# Patient Record
Sex: Male | Born: 1990 | Race: White | Hispanic: No | Marital: Married | Smoking: Never smoker
Health system: Southern US, Community
[De-identification: ages and names within clinical notes are randomized; demographics above are authoritative.]

---
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
## 2013-06-04 HISTORY — PX: ORIF HUMERUS FRACTURE: SHX2126

## 2016-11-16 ENCOUNTER — Encounter: Payer: Self-pay | Admitting: Family Medicine

## 2016-11-16 ENCOUNTER — Ambulatory Visit (INDEPENDENT_AMBULATORY_CARE_PROVIDER_SITE_OTHER): Payer: BLUE CROSS/BLUE SHIELD | Admitting: Family Medicine

## 2016-11-16 VITALS — BP 124/76 | Temp 97.9°F | Ht 72.0 in | Wt 208.0 lb

## 2016-11-16 DIAGNOSIS — R197 Diarrhea, unspecified: Secondary | ICD-10-CM | POA: Diagnosis not present

## 2016-11-16 LAB — POC URINALSYSI DIPSTICK (AUTOMATED)
Bilirubin, UA: NEGATIVE
Blood, UA: NEGATIVE
Glucose, UA: NEGATIVE
KETONES UA: NEGATIVE
Leukocytes, UA: NEGATIVE
Nitrite, UA: NEGATIVE
PH UA: 6 (ref 5.0–8.0)
PROTEIN UA: NEGATIVE
Spec Grav, UA: >=1.03 — AB (ref 1.010–1.025)
UROBILINOGEN UA: 0.2 U/dL

## 2016-11-16 LAB — CBC WITH DIFFERENTIAL/PLATELET
BASOS PCT: 1 %
Basophils Absolute: 45 cells/uL (ref 0–200)
EOS ABS: 135 {cells}/uL (ref 15–500)
EOS PCT: 3 %
HCT: 42.9 % (ref 38.5–50.0)
Hemoglobin: 13.9 g/dL (ref 13.2–17.1)
Lymphocytes Relative: 35 %
Lymphs Abs: 1575 cells/uL (ref 850–3900)
MCH: 28.4 pg (ref 27.0–33.0)
MCHC: 32.4 g/dL (ref 32.0–36.0)
MCV: 87.7 fL (ref 80.0–100.0)
MPV: 8.9 fL (ref 7.5–12.5)
Monocytes Absolute: 270 cells/uL (ref 200–950)
Monocytes Relative: 6 %
NEUTROS ABS: 2475 {cells}/uL (ref 1500–7800)
Neutrophils Relative %: 55 %
PLATELETS: 176 10*3/uL (ref 140–400)
RBC: 4.89 MIL/uL (ref 4.20–5.80)
RDW: 14.1 % (ref 11.0–15.0)
WBC: 4.5 10*3/uL (ref 3.8–10.8)

## 2016-11-16 LAB — BASIC METABOLIC PANEL
BUN: 22 mg/dL (ref 7–25)
CALCIUM: 9.2 mg/dL (ref 8.6–10.3)
CHLORIDE: 104 mmol/L (ref 98–110)
CO2: 26 mmol/L (ref 20–31)
CREATININE: 1.28 mg/dL (ref 0.60–1.35)
Glucose, Bld: 91 mg/dL (ref 65–99)
Potassium: 4.4 mmol/L (ref 3.5–5.3)
Sodium: 140 mmol/L (ref 135–146)

## 2016-11-16 LAB — TSH: TSH: 1.34 mIU/L (ref 0.40–4.50)

## 2016-11-16 LAB — HEPATIC FUNCTION PANEL
ALT: 26 U/L (ref 9–46)
AST: 31 U/L (ref 10–40)
Albumin: 4.5 g/dL (ref 3.6–5.1)
Alkaline Phosphatase: 58 U/L (ref 40–115)
BILIRUBIN DIRECT: 0.1 mg/dL (ref <=0.2)
BILIRUBIN INDIRECT: 0.2 mg/dL (ref 0.2–1.2)
BILIRUBIN TOTAL: 0.3 mg/dL (ref 0.2–1.2)
Total Protein: 6.2 g/dL (ref 6.1–8.1)

## 2016-11-16 NOTE — Progress Notes (Signed)
   Subjective:    Patient ID: Philip Nguyen, male    DOB: 2/Ross Ludwig20/1992, 26 y.o.   MRN: [id number]  HPI 26 yr old male to establish with us and to discuss 4 weeks of generalized abdominal cramps, diarrhea, a 6 lb weight loss, and loss of appetite. No fever. No nausea or vomiting. No UTI symptoms. No recent travel. No recent antibiotic use. Yesterday and today he feels better and he had a formed stool last night. No BMs so far today. His appetite has improved and the cramps are gone.    Review of Systems  Constitutional: Negative.   Respiratory: Negative.   Cardiovascular: Negative.   Gastrointestinal: Positive for abdominal pain and diarrhea. Negative for abdominal distention, anal bleeding, blood in stool, constipation, nausea, rectal pain and vomiting.  Genitourinary: Negative.   Musculoskeletal: Negative.   Skin: Negative.   Neurological: Negative.        Objective:   Physical Exam  Constitutional: He is oriented to person, place, and time. He appears well-developed and well-nourished. No distress.  Neck: No thyromegaly present.  Cardiovascular: Normal rate, regular rhythm, normal heart sounds and intact distal pulses.   Pulmonary/Chest: Effort normal and breath sounds normal. No respiratory distress. He has no wheezes. He has no rales.  Abdominal: Soft. Bowel sounds are normal. He exhibits no distension and no mass. There is no tenderness. There is no rebound and no guarding.  Lymphadenopathy:    He has no cervical adenopathy.  Neurological: He is alert and oriented to person, place, and time.          Assessment & Plan:  He has had 4 weeks of GI distress which sounds like an infection of some sort. He seems to be at the end of this and he feels much better. We will get labs today to shed some light on this process. No treatment is needed unless he gets worse again.  Gershon CraneStephen Rochele Lueck, MD

## 2016-11-16 NOTE — Patient Instructions (Signed)
WE NOW OFFER   Philip Nguyen's FAST TRACK!!!  SAME DAY Appointments for ACUTE CARE  Such as: Sprains, Injuries, cuts, abrasions, rashes, muscle pain, joint pain, back pain Colds, flu, sore throats, headache, allergies, cough, fever  Ear pain, sinus and eye infections Abdominal pain, nausea, vomiting, diarrhea, upset stomach Animal/insect bites  3 Easy Ways to Schedule: Walk-In Scheduling Call in scheduling Mychart Sign-up: [email or web address]         

## 2016-11-17 LAB — LIPASE: Lipase: 31 U/L (ref 7–60)

## 2016-11-17 LAB — AMYLASE: Amylase: 98 U/L (ref 21–101)

## 2017-01-02 ENCOUNTER — Ambulatory Visit (INDEPENDENT_AMBULATORY_CARE_PROVIDER_SITE_OTHER): Payer: BLUE CROSS/BLUE SHIELD | Admitting: Family Medicine

## 2017-01-02 ENCOUNTER — Encounter: Payer: Self-pay | Admitting: Family Medicine

## 2017-01-02 VITALS — BP 125/78 | HR 55 | Ht 72.0 in | Wt 205.0 lb

## 2017-01-02 DIAGNOSIS — L03011 Cellulitis of right finger: Secondary | ICD-10-CM | POA: Diagnosis not present

## 2017-01-02 MED ORDER — CEPHALEXIN 500 MG PO CAPS: 500.0000 mg | ORAL_CAPSULE | Freq: Four times a day (QID) | ORAL | 0 refills | Status: AC

## 2017-01-02 NOTE — Progress Notes (Signed)
   Subjective:    Patient ID: Philip Nguyen, male    DOB: 20-Sep-1990, 26 y.o.   MRN: [id number]  HPI Here for 5 days of pain and swelling in the right 3rd finger. No recent trauma.    Review of Systems  Constitutional: Negative.   Respiratory: Negative.   Cardiovascular: Negative.   Skin: Positive for color change.       Objective:   Physical Exam  Constitutional: He appears well-developed and well-nourished.  Cardiovascular: Normal rate, regular rhythm, normal heart sounds and intact distal pulses.   Pulmonary/Chest: Effort normal and breath sounds normal. No respiratory distress. He has no wheezes. He has no rales.  Skin:  The skin around the right 3rd fingernail is red, puffy, and tender           Assessment & Plan:  Paronychia, treat with Keflex. Gershon CraneStephen Yeng Perz, MD

## 2017-01-02 NOTE — Patient Instructions (Signed)
WE NOW OFFER   Philip Nguyen's FAST TRACK!!!  SAME DAY Appointments for ACUTE CARE  Such as: Sprains, Injuries, cuts, abrasions, rashes, muscle pain, joint pain, back pain Colds, flu, sore throats, headache, allergies, cough, fever  Ear pain, sinus and eye infections Abdominal pain, nausea, vomiting, diarrhea, upset stomach Animal/insect bites  3 Easy Ways to Schedule: Walk-In Scheduling Call in scheduling Mychart Sign-up: [email or web address]         

## 2017-11-20 DIAGNOSIS — Z412 Encounter for routine and ritual male circumcision: Secondary | ICD-10-CM | POA: Diagnosis not present

## 2017-11-21 DIAGNOSIS — J029 Acute pharyngitis, unspecified: Secondary | ICD-10-CM | POA: Diagnosis not present

## 2017-11-21 DIAGNOSIS — M791 Myalgia, unspecified site: Secondary | ICD-10-CM | POA: Diagnosis not present

## 2017-11-21 DIAGNOSIS — T63481A Toxic effect of venom of other arthropod, accidental (unintentional), initial encounter: Secondary | ICD-10-CM | POA: Diagnosis not present

## 2017-11-22 ENCOUNTER — Encounter (HOSPITAL_COMMUNITY): Payer: Self-pay

## 2017-11-22 ENCOUNTER — Emergency Department (HOSPITAL_COMMUNITY)
Admission: EM | Admit: 2017-11-22 | Discharge: 2017-11-23 | Disposition: A | Discharge status: Home / Self Care | Payer: BLUE CROSS/BLUE SHIELD | Attending: Emergency Medicine | Admitting: Emergency Medicine

## 2017-11-22 ENCOUNTER — Other Ambulatory Visit: Payer: Self-pay

## 2017-11-22 DIAGNOSIS — R1084 Generalized abdominal pain: Secondary | ICD-10-CM | POA: Diagnosis not present

## 2017-11-22 DIAGNOSIS — K529 Noninfective gastroenteritis and colitis, unspecified: Secondary | ICD-10-CM | POA: Diagnosis not present

## 2017-11-22 DIAGNOSIS — R109 Unspecified abdominal pain: Secondary | ICD-10-CM | POA: Diagnosis not present

## 2017-11-22 DIAGNOSIS — R197 Diarrhea, unspecified: Secondary | ICD-10-CM | POA: Diagnosis not present

## 2017-11-22 LAB — CBC
HCT: 43.6 % (ref 39.0–52.0)
Hemoglobin: 13.9 g/dL (ref 13.0–17.0)
MCH: 27.4 pg (ref 26.0–34.0)
MCHC: 31.9 g/dL (ref 30.0–36.0)
MCV: 85.8 fL (ref 78.0–100.0)
PLATELETS: 143 10*3/uL — AB (ref 150–400)
RBC: 5.08 MIL/uL (ref 4.22–5.81)
RDW: 13.6 % (ref 11.5–15.5)
WBC: 5.4 10*3/uL (ref 4.0–10.5)

## 2017-11-22 LAB — COMPREHENSIVE METABOLIC PANEL
ALT: 27 U/L (ref 17–63)
AST: 31 U/L (ref 15–41)
Albumin: 3.5 g/dL (ref 3.5–5.0)
Alkaline Phosphatase: 34 U/L — ABNORMAL LOW (ref 38–126)
Anion gap: 8 (ref 5–15)
BILIRUBIN TOTAL: 0.4 mg/dL (ref 0.3–1.2)
BUN: 13 mg/dL (ref 6–20)
CO2: 28 mmol/L (ref 22–32)
CREATININE: 1.37 mg/dL — AB (ref 0.61–1.24)
Calcium: 8.9 mg/dL (ref 8.9–10.3)
Chloride: 103 mmol/L (ref 101–111)
GFR calc Af Amer: >60 mL/min (ref >60)
Glucose, Bld: 144 mg/dL — ABNORMAL HIGH (ref 65–99)
Potassium: 3.7 mmol/L (ref 3.5–5.1)
Sodium: 139 mmol/L (ref 135–145)
TOTAL PROTEIN: 6.3 g/dL — AB (ref 6.5–8.1)

## 2017-11-22 LAB — URINALYSIS, ROUTINE W REFLEX MICROSCOPIC
Bacteria, UA: NONE SEEN
Bilirubin Urine: NEGATIVE
GLUCOSE, UA: 50 mg/dL — AB
KETONES UR: NEGATIVE mg/dL
LEUKOCYTES UA: NEGATIVE
Nitrite: NEGATIVE
PH: 5 (ref 5.0–8.0)
Protein, ur: 30 mg/dL — AB
SPECIFIC GRAVITY, URINE: 1.027 (ref 1.005–1.030)

## 2017-11-22 LAB — LIPASE, BLOOD: Lipase: 35 U/L (ref 11–51)

## 2017-11-22 MED ORDER — SODIUM CHLORIDE 0.9 % IV BOLUS
1000.0000 mL | Freq: Once | INTRAVENOUS | Status: AC
  Administered 2017-11-22: 1000 mL via INTRAVENOUS

## 2017-11-22 NOTE — ED Triage Notes (Signed)
Pt endorses generalized bodyaches, chills x 3 days. [facility] doxycycline for possible [place] spotted fever. Pt then began having generalized abd pain yesterday afternoon. Diarrhea x 3 days. VSS. Denies n/v. Afebrile.

## 2017-11-23 ENCOUNTER — Emergency Department (HOSPITAL_COMMUNITY): Payer: BLUE CROSS/BLUE SHIELD

## 2017-11-23 DIAGNOSIS — R197 Diarrhea, unspecified: Secondary | ICD-10-CM | POA: Diagnosis not present

## 2017-11-23 DIAGNOSIS — R109 Unspecified abdominal pain: Secondary | ICD-10-CM | POA: Diagnosis not present

## 2017-11-23 MED ORDER — DICYCLOMINE HCL 10 MG PO CAPS
10.0000 mg | ORAL_CAPSULE | Freq: Once | ORAL | Status: AC
  Administered 2017-11-23: 10 mg via ORAL
  Filled 2017-11-23: qty 1

## 2017-11-23 MED ORDER — CIPROFLOXACIN HCL 500 MG PO TABS
500.0000 mg | ORAL_TABLET | Freq: Once | ORAL | Status: AC
  Administered 2017-11-23: 500 mg via ORAL
  Filled 2017-11-23: qty 1

## 2017-11-23 MED ORDER — IOHEXOL 300 MG/ML  SOLN
100.0000 mL | Freq: Once | INTRAMUSCULAR | Status: AC | PRN
  Administered 2017-11-23: 100 mL via INTRAVENOUS

## 2017-11-23 MED ORDER — CIPROFLOXACIN HCL 500 MG PO TABS: 500.0000 mg | ORAL_TABLET | Freq: Two times a day (BID) | ORAL | 0 refills | Status: DC

## 2017-11-23 MED ORDER — DICYCLOMINE HCL 20 MG PO TABS: 20.0000 mg | ORAL_TABLET | Freq: Two times a day (BID) | ORAL | 0 refills | Status: DC
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
## 2017-11-23 NOTE — ED Notes (Signed)
Patient transported to CT 

## 2017-11-23 NOTE — Discharge Instructions (Signed)
Stop Doxycycline Start Cipro twice daily for the next week Take Bentyl as needed for abdominal pain/cramping Return if worsening

## 2017-11-26 ENCOUNTER — Encounter: Payer: Self-pay | Admitting: Family Medicine

## 2017-11-26 ENCOUNTER — Ambulatory Visit: Payer: BLUE CROSS/BLUE SHIELD | Admitting: Family Medicine

## 2017-11-26 VITALS — BP 114/70 | HR 69 | Temp 98.4°F | Ht 73.0 in | Wt 204.8 lb

## 2017-11-26 DIAGNOSIS — K529 Noninfective gastroenteritis and colitis, unspecified: Secondary | ICD-10-CM

## 2017-11-26 NOTE — Progress Notes (Signed)
   Subjective:    Patient ID: Philip Nguyen, male    DOB: 11-03-1990, 27 y.o.   MRN: [id number]  HPI Here to follow up an ER visit on 11-22-17 for fever to 102 degrees, abdominal pains (worse in the RLQ), body aches, and diarrhea. No nausea or vomiting. No recent travel. [facility] and was started on Doxycycline. In the ER is WBC was normal. A CT scan showed a normal appendix, diffuse thickening of the colon walls, and some RLQ mesenteric lymphadenopathy. He was diagnosed with enterocolitis. The Doxycycline was stopped and he was started on Cipro. Since then he has bee feeling better. The fever has resolved and the abdominal pains have greatly improved. His appetite is returning. The main thing bothering him now is diarrhea. He has gone back to work and he even had a light work out at his gym yesterday.    Review of Systems  Constitutional: Negative.   Respiratory: Negative.   Cardiovascular: Negative.   Gastrointestinal: Positive for abdominal pain and diarrhea. Negative for abdominal distention, anal bleeding, blood in stool, constipation, nausea, rectal pain and vomiting.  Genitourinary: Negative.   Neurological: Negative.        Objective:   Physical Exam  Constitutional: He appears well-developed and well-nourished. No distress.  Cardiovascular: Normal rate, regular rhythm, normal heart sounds and intact distal pulses.  Pulmonary/Chest: Effort normal and breath sounds normal. No stridor. No respiratory distress. He has no wheezes. He has no rales.  Abdominal: Soft. Bowel sounds are normal. He exhibits no distension and no mass. There is no tenderness. There is no rebound and no guarding. No hernia.          Assessment & Plan:  Resolving enterocolitis. He will finish out the Cipro. He may use Imodium as needed for the diarrhea. Recheck prn.  Gershon CraneStephen Fry, MD

## 2018-02-10 DIAGNOSIS — J019 Acute sinusitis, unspecified: Secondary | ICD-10-CM | POA: Diagnosis not present

## 2018-02-10 DIAGNOSIS — B9689 Other specified bacterial agents as the cause of diseases classified elsewhere: Secondary | ICD-10-CM | POA: Diagnosis not present

## 2018-02-10 DIAGNOSIS — R05 Cough: Secondary | ICD-10-CM | POA: Diagnosis not present

## 2018-08-18 IMAGING — CT CT ABD-PELV W/ CM
2 of 4 series · 16 of 46 positions shown, 18 images · IV contrast (Omni 300)
Comparison: None.

CLINICAL DATA: Abdominal pain with body ache and chills diarrhea

EXAM:
CT ABDOMEN AND PELVIS WITH CONTRAST
TECHNIQUE: Multidetector CT imaging of the abdomen and pelvis was performed
using the standard protocol following bolus administration of
intravenous contrast.
CONTRAST:  100mL OMNIPAQUE IOHEXOL 300 MG/ML  SOLN

[Series 3: a/p w/ 5mm · axial · 0.98mm/px · z∈[+576,+1051]mm · 13 of 105 slices shown, 15 images]
[im 5/105  soft-tissue]
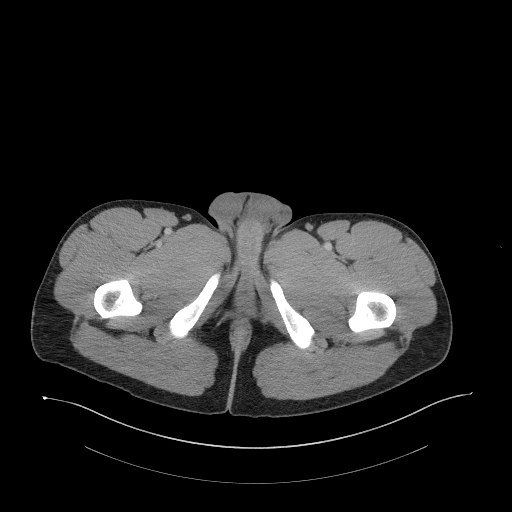
[im 5/105  bone]
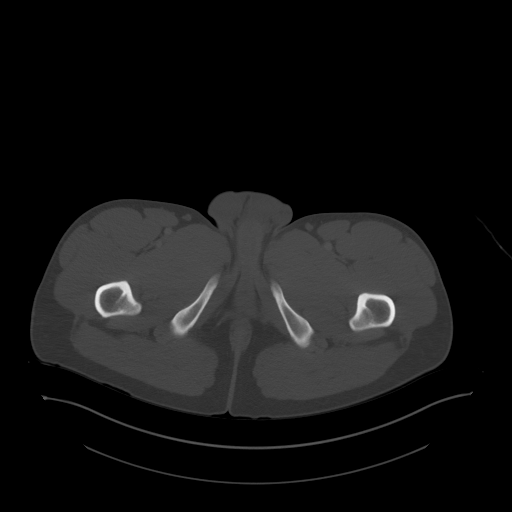
[im 13/105  soft-tissue]
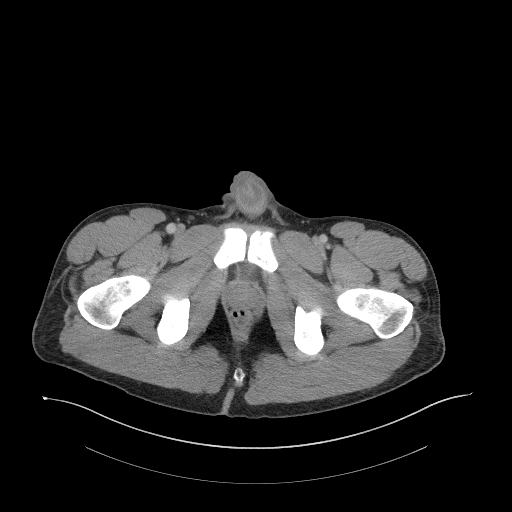
[im 21/105  soft-tissue]
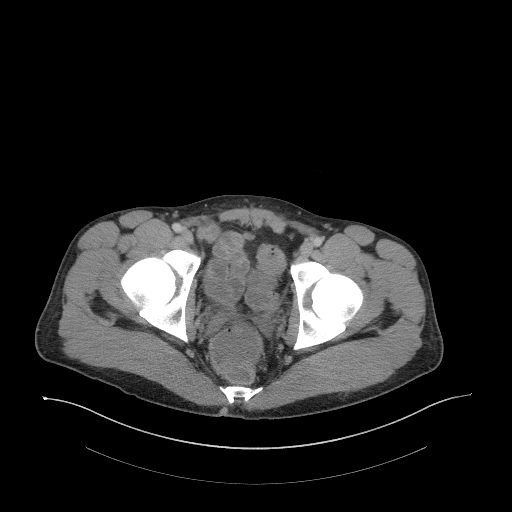
[im 30/105  soft-tissue]
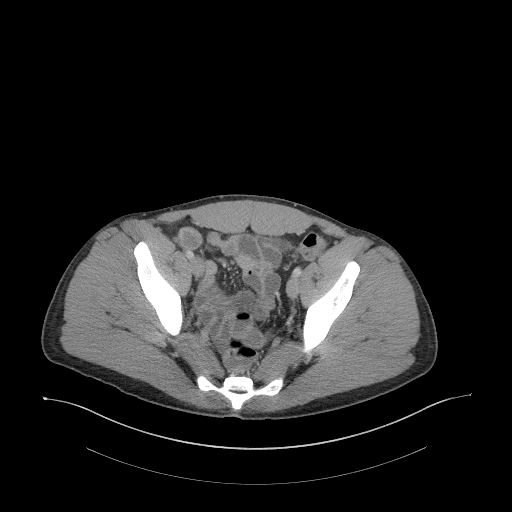
[im 38/105  soft-tissue]
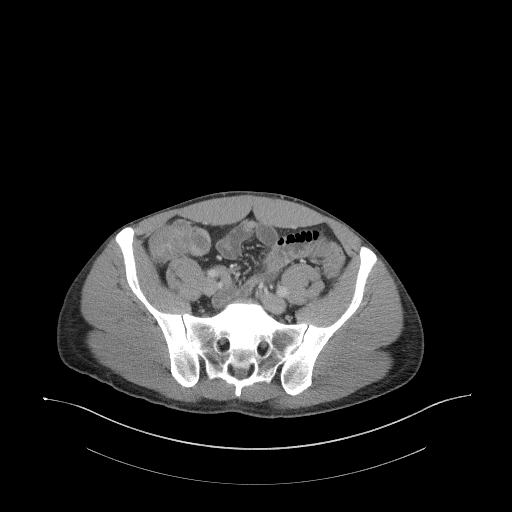
[im 46/105  soft-tissue]
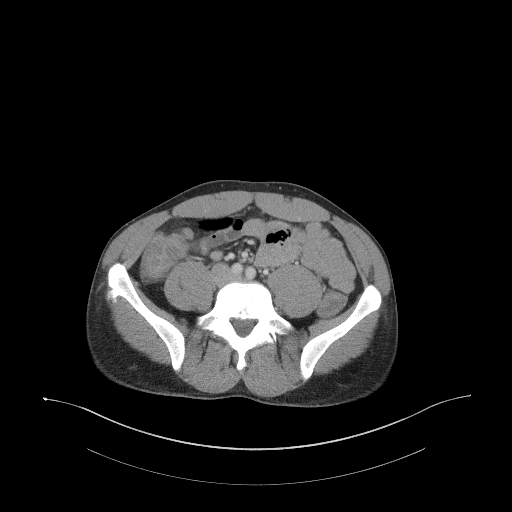
[im 55/105  soft-tissue]
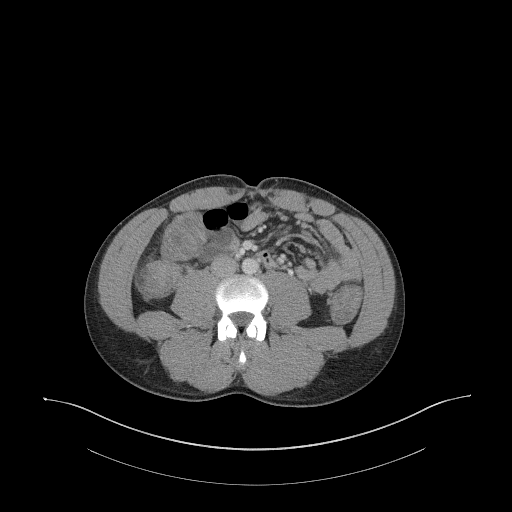
[im 59/105  soft-tissue]
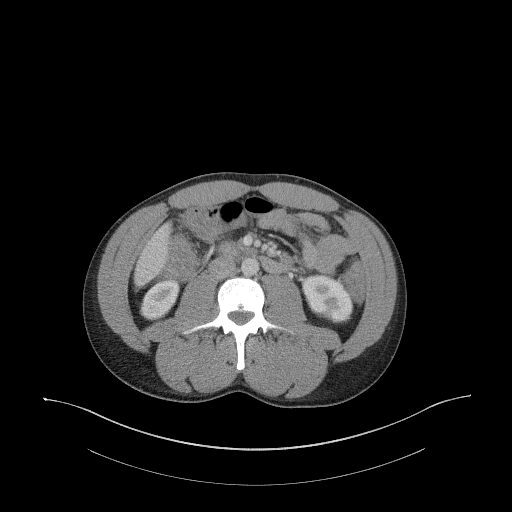
[im 67/105  soft-tissue]
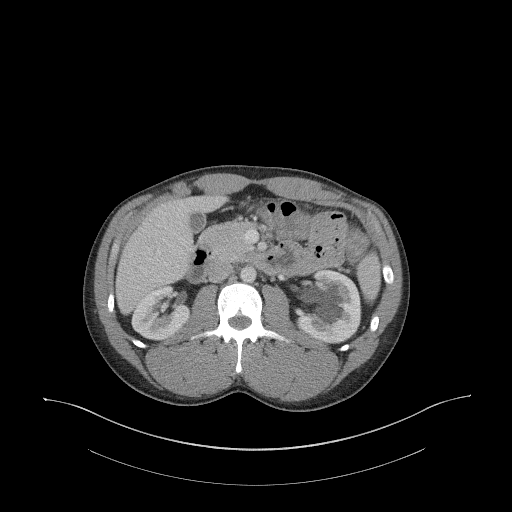
[im 67/105  bone]
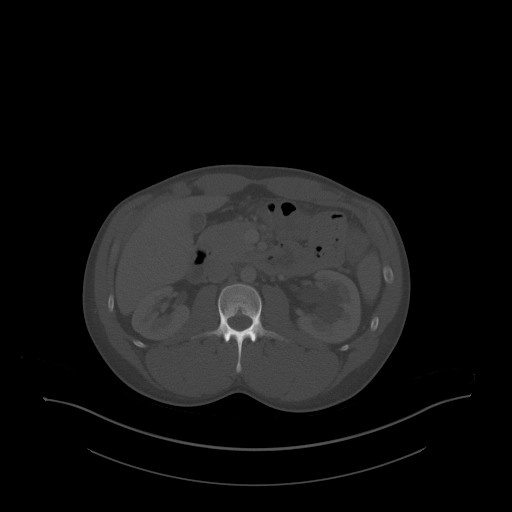
[im 75/105  soft-tissue]
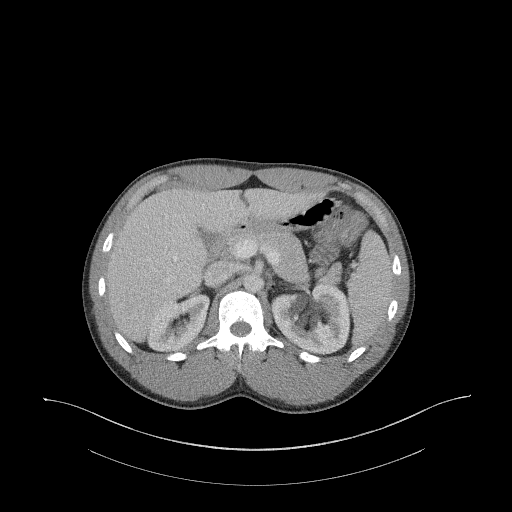
[im 84/105  soft-tissue]
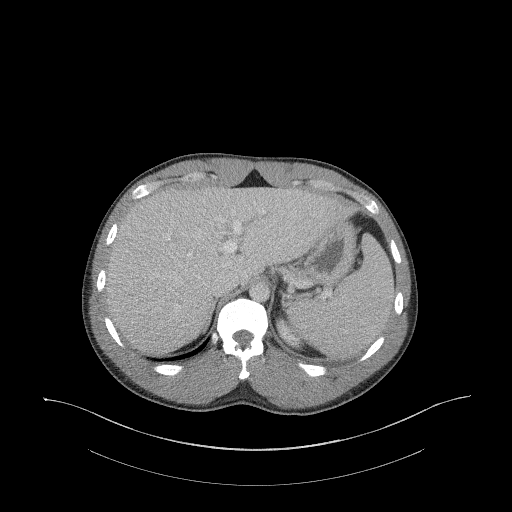
[im 92/105  soft-tissue]
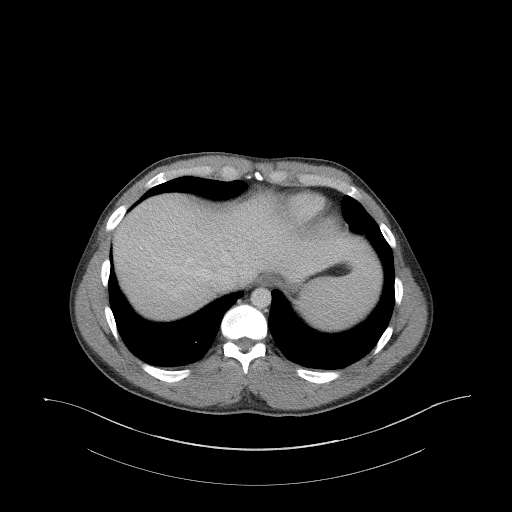
[im 100/105  soft-tissue]
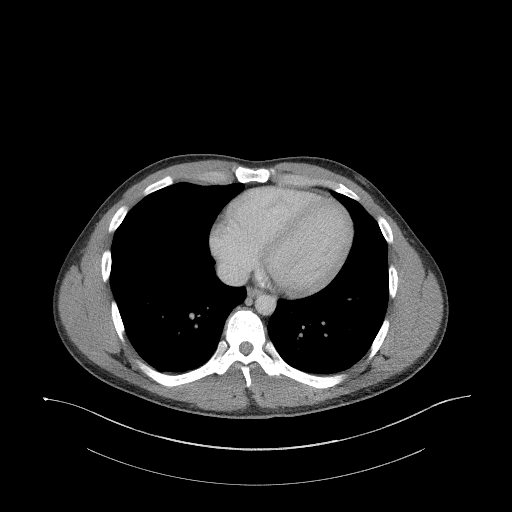

[Series 6: a/p w/ cor · coronal · 0.78mm/px · 3 of 150 slices shown]
[im 50/150  soft-tissue]
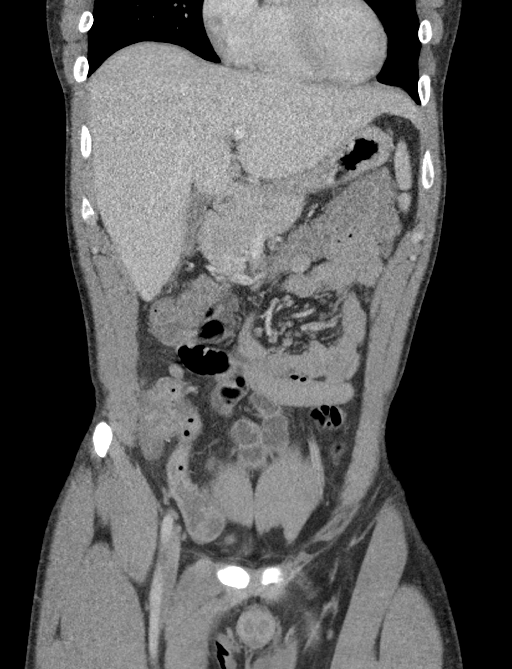
[im 67/150  soft-tissue]
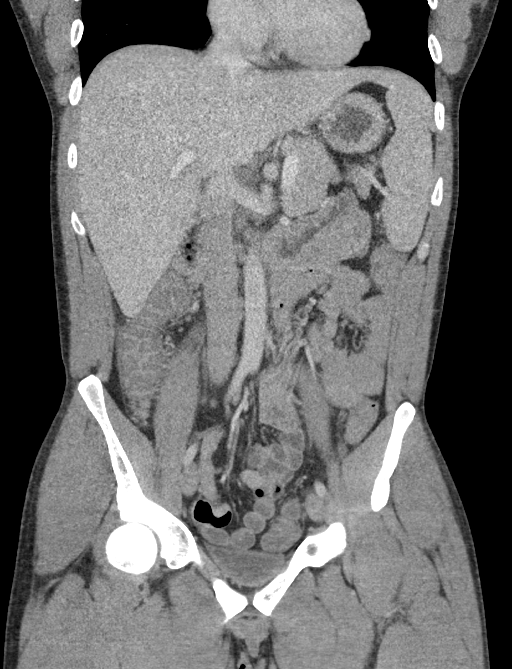
[im 83/150  soft-tissue]
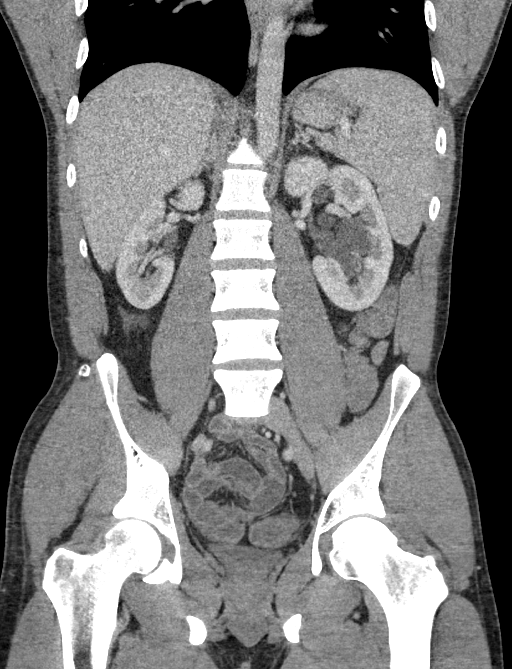

[16 of 46 positions shown; findings below may reference images not displayed]

FINDINGS: Lower chest: No acute abnormality.

Hepatobiliary: No focal liver abnormality is seen. No gallstones,
gallbladder wall thickening, or biliary dilatation.

Pancreas: Unremarkable. No pancreatic ductal dilatation or
surrounding inflammatory changes.

Spleen: Normal in size without focal abnormality.

Adrenals/Urinary Tract: Adrenal glands are within normal limits.
Suspected left parapelvic cysts as opposed to hydronephrosis. Right
kidney is normal. Bladder normal.

Stomach/Bowel: Stomach is nonenlarged. Fluid-filled small bowel
without dilatation. Colon wall thickening involving the cecum,
ascending colon, hepatic flexure, transverse colon, splenic flexure
and descending colon. Some mucosal enhancement and wall thickening
of the terminal ileum. Appendix best seen on coronal views and is
normal.

Vascular/Lymphatic: Multiple enlarged right lower quadrant
mesenteric lymph nodes, measuring up to 12 mm, likely reactive.
Nonaneurysmal aorta

Reproductive: Prostate is unremarkable.

Other: Small amount of free fluid in the pelvis.  No free air

Musculoskeletal: No acute or significant osseous findings.
IMPRESSION: 1. Wall thickening involving the majority of the colon with some
sparing of the rectosigmoid colon. There also appears to be mild
wall thickening of the terminal ileum. Findings are consistent with
enterocolitis which may be secondary to infection or inflammatory
bowel disease. Negative for appendicitis.
2. Multiple right lower quadrant mesenteric lymph nodes, likely
reactive
3. Probable left parapelvic cysts.
4. Small amount of free fluid in the pelvis

## 2018-09-29 ENCOUNTER — Ambulatory Visit (INDEPENDENT_AMBULATORY_CARE_PROVIDER_SITE_OTHER): Payer: BLUE CROSS/BLUE SHIELD | Admitting: Family Medicine

## 2018-09-29 ENCOUNTER — Other Ambulatory Visit: Payer: Self-pay

## 2018-09-29 ENCOUNTER — Encounter: Payer: Self-pay | Admitting: Family Medicine

## 2018-09-29 DIAGNOSIS — F419 Anxiety disorder, unspecified: Secondary | ICD-10-CM

## 2018-09-29 DIAGNOSIS — R030 Elevated blood-pressure reading, without diagnosis of hypertension: Secondary | ICD-10-CM | POA: Diagnosis not present

## 2018-09-29 MED ORDER — ESCITALOPRAM OXALATE 10 MG PO TABS: 10.0000 mg | ORAL_TABLET | Freq: Every day | ORAL | 2 refills | Status: DC
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
## 2019-05-18 ENCOUNTER — Telehealth: Payer: Self-pay

## 2019-05-18 NOTE — Telephone Encounter (Signed)
Copied from [place] [phone]. Topic: General - Call Back - No Documentation >> May 08, 2019 12:42 PM Erick Blinks wrote: Reason for CRM: Gerald Stabs from [facility] processing center called to confirm that the fax received today was sent to the copy service for medical records. Please advise >> May 08, 2019  1:45 PM Para Skeans A wrote: Wrong office.

## 2019-05-18 NOTE — Telephone Encounter (Signed)
Copied from CRM #[id number]. Topic: General - Call Back - No Documentation >> May 08, 2019 12:42 PM Eason, Dominique M wrote: Reason for CRM: Chris from [facility] processing center called to confirm that the fax received today was sent to the copy service for medical records. Please advise >> May 08, 2019  1:45 PM Overman, Brittany A wrote: Wrong office.  

## 2019-05-26 NOTE — Telephone Encounter (Signed)
Noted nothing further needed. 

## 2019-09-04 ENCOUNTER — Ambulatory Visit: Payer: Self-pay | Attending: Internal Medicine

## 2019-09-04 DIAGNOSIS — Z23 Encounter for immunization: Secondary | ICD-10-CM

## 2019-09-04 NOTE — Progress Notes (Signed)
   Covid-19 Vaccination Clinic  Name:  Philip Nguyen    MRN: [id number] DOB: 05-Dec-1990  09/04/2019  Mr. Philip Nguyen was observed post Covid-19 immunization for 15 minutes without incident. He was provided with Vaccine Information Sheet and instruction to access the V-Safe system.   Mr. Philip Nguyen was instructed to call 911 with any severe reactions post vaccine: Marland Kitchen Difficulty breathing  . Swelling of face and throat  . A fast heartbeat  . A bad rash all over body  . Dizziness and weakness   Immunizations Administered    Name Date Dose VIS Date Route   Pfizer COVID-19 Vaccine 09/04/2019  9:20 AM 0.3 mL 05/15/2019 Intramuscular   Manufacturer: [employer], [employer]   Lot: [id number]   NDC: [address]-8

## 2019-09-28 ENCOUNTER — Ambulatory Visit: Payer: Self-pay | Attending: Internal Medicine

## 2019-09-28 DIAGNOSIS — Z23 Encounter for immunization: Secondary | ICD-10-CM

## 2019-09-28 NOTE — Progress Notes (Signed)
   Covid-19 Vaccination Clinic  Name:  Philip Nguyen    MRN: [id number] DOB: 31-Jan-1991  09/28/2019  [name] was observed post Covid-19 immunization for 15 minutes without incident. He was provided with Vaccine Information Sheet and instruction to access the V-Safe system.   [name] was instructed to call 911 with any severe reactions post vaccine: Marland Kitchen Difficulty breathing  . Swelling of face and throat  . A fast heartbeat  . A bad rash all over body  . Dizziness and weakness   Immunizations Administered    Name Date Dose VIS Date Route   Pfizer COVID-19 Vaccine 09/28/2019 12:00 PM 0.3 mL 07/29/2018 Intramuscular   Manufacturer: [employer], [employer]   Lot: [id number]   NDC: [address]-1

## 2020-03-14 ENCOUNTER — Other Ambulatory Visit: Payer: Self-pay

## 2020-03-14 ENCOUNTER — Encounter: Payer: Self-pay | Admitting: Family Medicine

## 2020-03-14 ENCOUNTER — Ambulatory Visit: Payer: 59 | Admitting: Family Medicine

## 2020-03-14 VITALS — BP 128/76 | HR 66 | Temp 98.1°F | Ht 73.0 in | Wt 217.8 lb

## 2020-03-14 DIAGNOSIS — E291 Testicular hypofunction: Secondary | ICD-10-CM | POA: Diagnosis not present

## 2020-03-14 MED ORDER — "SYRINGE 22G X 1"" 3 ML MISC": 1.0000 "application " | 1 refills | Status: DC

## 2020-03-14 MED ORDER — TESTOSTERONE CYPIONATE 200 MG/ML IM SOLN: 200.0000 mg | INTRAMUSCULAR | 3 refills | Status: DC

## 2020-03-14 NOTE — Progress Notes (Signed)
   Subjective:    Patient ID: Philip Nguyen, male    DOB: 1990-06-08, 29 y.o.   MRN: [id number]  HPI Here to discuss some recent lab results he got. These are dated 03-01-20 from [facility]. He has been feeling tired and has lost some muscle mass and his libido has been low lately, so he went to a lab for this. All these results were normal except the total testosoterone was extremely low at 12, the free testosterone was low at 1.5,DHEA was low at 85.8. he has no trouble with erections. 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 He is interested in taking a testosterone supplement if possible.    Review of Systems  Constitutional: Positive for fatigue.  Respiratory: Negative.   Cardiovascular: Negative.   Gastrointestinal: Negative.   Endocrine: Negative.   Genitourinary: Negative.   Neurological: Negative.        Objective:   Physical Exam Constitutional:      Appearance: Normal appearance.  Cardiovascular:     Rate and Rhythm: Normal rate and regular rhythm.     Pulses: Normal pulses.     Heart sounds: Normal heart sounds.  Pulmonary:     Effort: Pulmonary effort is normal.     Breath sounds: Normal breath sounds.  Neurological:     Mental Status: He is alert.           Assessment & Plan:  Hypogonadism, we will start him on testosterone cypionate 200 mg (1 ml) every 7 days. Recheck levels in 90 days. Gershon Crane, MD

## 2020-04-01 ENCOUNTER — Encounter: Payer: Self-pay | Admitting: Family Medicine

## 2020-04-04 NOTE — Telephone Encounter (Signed)
Nipple stimulation is common with taking testosterone. There are medications to block estrogen receptors but this can get very complicated. The simplest thing to try would be to cut back on the testosterone dosing a little.

## 2020-04-12 NOTE — Telephone Encounter (Signed)
I understand. There are other ways to raise testosterone levels without taking testosterone, but these are handled by Urology. I would be happy to refer him to Urology to look into this if he wants. In the meantime totally stop the shots

## 2020-05-01 ENCOUNTER — Encounter: Payer: Self-pay | Admitting: Family Medicine

## 2020-05-03 NOTE — Telephone Encounter (Signed)
Noted  

## 2020-06-12 ENCOUNTER — Encounter: Payer: Self-pay | Admitting: Family Medicine

## 2020-06-13 MED ORDER — TESTOSTERONE CYPIONATE 200 MG/ML IM SOLN: 200.0000 mg | INTRAMUSCULAR | 3 refills | Status: DC

## 2020-06-13 NOTE — Telephone Encounter (Signed)
I sent in a new rx

## 2020-06-15 ENCOUNTER — Telehealth: Payer: Self-pay | Admitting: Family Medicine

## 2020-06-15 NOTE — Telephone Encounter (Signed)
Is it ok to refill Testosterone early? See notes below.

## 2020-06-15 NOTE — Telephone Encounter (Signed)
See Patient message encounter on 06/12/20, this was taken care of there.

## 2020-06-15 NOTE — Telephone Encounter (Signed)
Yes he can have the early refill (see my other note)

## 2020-06-15 NOTE — Telephone Encounter (Signed)
[facility] called and spoke to KP, Pensions consultant and advised Dr. Clent Ridges says to go ahead and refill early. She says the patient will have to pay out of pocket $121.99 because he picked up last on 06/02/20 for 1 month supply and the insurance will not pay. She says the insurance will pay on 07/03/20. MyChart message sent to patient to make him aware.

## 2020-06-15 NOTE — Telephone Encounter (Signed)
Please tell the pharmacy it is okay to fill early

## 2020-06-15 NOTE — Telephone Encounter (Signed)
Pharmacy is calling in to see if they are able to fill the Rx testosterone sypionate (DEPOTESTOSTERONE CYPIONATE) 200 MG due to the stating that he dropped it and broke the bottle.  Pharmacy would like to have a call back.

## 2021-02-05 ENCOUNTER — Other Ambulatory Visit: Payer: Self-pay | Admitting: Family Medicine

## 2021-02-10 ENCOUNTER — Telehealth: Payer: Self-pay | Admitting: Family Medicine

## 2021-02-10 ENCOUNTER — Encounter: Payer: Self-pay | Admitting: Family Medicine

## 2021-02-10 NOTE — Telephone Encounter (Signed)
Patient called today needing a refill of testosterone cypionate (DEPOTESTOSTERONE CYPIONATE) 200 MG/ML injection to be sent to [id number] - [place], [place] - [address] AT [facility] OF [address]. Patient says that he is behind on medication and is needing it as soon as possible.  Patient would like a call at [phone] once Rx has been sent in.  Please advise.

## 2021-02-10 NOTE — Telephone Encounter (Signed)
Last OV 03/14/20

## 2021-02-10 NOTE — Telephone Encounter (Signed)
Spoke with pt advised that Dr Clent Ridges wants him to schedule a f/u visit for medication renewal. Pt is scheduled for 02/15/2021

## 2021-02-10 NOTE — Telephone Encounter (Signed)
Epic will not let me refill Testosterone Rx Pt LOV was on 03/14/2021 Last refill done on 06/13/2020 Left a message for pt to call the office and schedule a f/u visit

## 2021-02-13 MED ORDER — TESTOSTERONE CYPIONATE 200 MG/ML IM SOLN: 200.0000 mg | INTRAMUSCULAR | 3 refills | Status: DC

## 2021-02-13 NOTE — Telephone Encounter (Signed)
Pt is scheduled for a visit on 02/15/2021

## 2021-02-13 NOTE — Telephone Encounter (Signed)
Lvm informing patient that prescription has been sent to pharmacy

## 2021-02-13 NOTE — Telephone Encounter (Signed)
I sent in refills this morning

## 2021-02-14 ENCOUNTER — Other Ambulatory Visit: Payer: Self-pay

## 2021-02-15 ENCOUNTER — Encounter: Payer: Self-pay | Admitting: Family Medicine

## 2021-02-15 ENCOUNTER — Ambulatory Visit (INDEPENDENT_AMBULATORY_CARE_PROVIDER_SITE_OTHER): Payer: 59 | Admitting: Family Medicine

## 2021-02-15 VITALS — BP 126/82 | HR 80 | Temp 98.8°F | Wt 213.0 lb

## 2021-02-15 DIAGNOSIS — Z0000 Encounter for general adult medical examination without abnormal findings: Secondary | ICD-10-CM

## 2021-02-15 DIAGNOSIS — E291 Testicular hypofunction: Secondary | ICD-10-CM

## 2021-02-15 MED ORDER — TESTOSTERONE CYPIONATE 200 MG/ML IM SOLN: 200.0000 mg | INTRAMUSCULAR | 3 refills | Status: DC

## 2021-02-15 MED ORDER — "SYRINGE 22G X 1"" 3 ML MISC": 1.0000 "application " | 3 refills | Status: DC

## 2021-02-15 NOTE — Progress Notes (Signed)
   Subjective:    Patient ID: Philip Nguyen, male    DOB: [birth year], 30 y.o.   MRN: [id number]  HPI Here for a well exam and to follow up on low testosterone. He feels good and has no concerns.    Review of Systems  Constitutional: Negative.   HENT: Negative.    Eyes: Negative.   Respiratory: Negative.    Cardiovascular: Negative.   Gastrointestinal: Negative.   Genitourinary: Negative.   Musculoskeletal: Negative.   Skin: Negative.   Neurological: Negative.   Psychiatric/Behavioral: Negative.        Objective:   Physical Exam Constitutional:      General: He is not in acute distress.    Appearance: Normal appearance. He is well-developed. He is not diaphoretic.  HENT:     Head: Normocephalic and atraumatic.     Right Ear: External ear normal.     Left Ear: External ear normal.     Nose: Nose normal.     Mouth/Throat:     Pharynx: No oropharyngeal exudate.  Eyes:     General: No scleral icterus.       Right eye: No discharge.        Left eye: No discharge.     Conjunctiva/sclera: Conjunctivae normal.     Pupils: Pupils are equal, round, and reactive to light.  Neck:     Thyroid: No thyromegaly.     Vascular: No JVD.     Trachea: No tracheal deviation.  Cardiovascular:     Rate and Rhythm: Normal rate and regular rhythm.     Heart sounds: Normal heart sounds. No murmur heard.   No friction rub. No gallop.  Pulmonary:     Effort: Pulmonary effort is normal. No respiratory distress.     Breath sounds: Normal breath sounds. No wheezing or rales.  Chest:     Chest wall: No tenderness.  Abdominal:     General: Bowel sounds are normal. There is no distension.     Palpations: Abdomen is soft. There is no mass.     Tenderness: There is no abdominal tenderness. There is no guarding or rebound.  Genitourinary:    Penis: Normal. No tenderness.      Testes: Normal.  Musculoskeletal:        General: No tenderness. Normal range of motion.     Cervical back:  Neck supple.  Lymphadenopathy:     Cervical: No cervical adenopathy.  Skin:    General: Skin is warm and dry.     Coloration: Skin is not pale.     Findings: No erythema or rash.  Neurological:     Mental Status: He is alert and oriented to person, place, and time.     Cranial Nerves: No cranial nerve deficit.     Motor: No abnormal muscle tone.     Coordination: Coordination normal.     Deep Tendon Reflexes: Reflexes are normal and symmetric. Reflexes normal.  Psychiatric:        Behavior: Behavior normal.        Thought Content: Thought content normal.        Judgment: Judgment normal.          Assessment & Plan:  Well exam. We discussed diet and exercise. Get labs including testosterone levels. His testosterone rx was refilled.  Gershon Crane, MD

## 2021-03-09 ENCOUNTER — Telehealth: Payer: 59 | Admitting: Family Medicine

## 2021-05-30 ENCOUNTER — Other Ambulatory Visit: Payer: Self-pay | Admitting: Family Medicine

## 2021-05-30 NOTE — Telephone Encounter (Signed)
Pt LOV was on 02/15/2021 Last refill was done on 02/15/2021 Please advise

## 2021-07-07 ENCOUNTER — Telehealth: Payer: 59 | Admitting: Family Medicine

## 2021-07-07 DIAGNOSIS — B9689 Other specified bacterial agents as the cause of diseases classified elsewhere: Secondary | ICD-10-CM

## 2021-07-07 DIAGNOSIS — J019 Acute sinusitis, unspecified: Secondary | ICD-10-CM

## 2021-07-07 DIAGNOSIS — R058 Other specified cough: Secondary | ICD-10-CM | POA: Diagnosis not present

## 2021-07-07 MED ORDER — AMOXICILLIN-POT CLAVULANATE 875-125 MG PO TABS: 1.0000 | ORAL_TABLET | Freq: Two times a day (BID) | ORAL | 0 refills | Status: AC

## 2021-07-07 NOTE — Progress Notes (Signed)
Virtual Visit Consent   [name], you are scheduled for a virtual visit with a [facility] provider today.     Just as with appointments in the office, your consent must be obtained to participate.  Your consent will be active for this visit and any virtual visit you may have with one of our providers in the next 365 days.     If you have a MyChart account, a copy of this consent can be sent to you electronically.  All virtual visits are billed to your insurance company just like a traditional visit in the office.    As this is a virtual visit, video technology does not allow for your provider to perform a traditional examination.  This may limit your provider's ability to fully assess your condition.  If your provider identifies any concerns that need to be evaluated in person or the need to arrange testing (such as labs, EKG, etc.), we will make arrangements to do so.     Although advances in technology are sophisticated, we cannot ensure that it will always work on either your end or our end.  If the connection with a video visit is poor, the visit may have to be switched to a telephone visit.  With either a video or telephone visit, we are not always able to ensure that we have a secure connection.     I need to obtain your verbal consent now.   Are you willing to proceed with your visit today?    [name] has provided verbal consent on 07/07/2021 for a virtual visit (video or telephone).   [name], NP   Date: 07/07/2021 7:46 AM   Virtual Visit via Video Note   I, [name], connected with  Philip Nguyen  ([id number], [birth year]) on 07/07/21 at  7:45 AM EST by a video-enabled telemedicine application and verified that I am speaking with the correct person using two identifiers.  Location: Patient: Virtual Visit Location Patient: Home Provider: Virtual Visit Location Provider: Home Office   I discussed the limitations of evaluation  and management by telemedicine and the availability of in person appointments. The patient expressed understanding and agreed to proceed.    History of Present Illness: Philip Nguyen is a 31 y.o. who identifies as a male who was assigned male at birth, and is being seen today for sinus infection. Onset was 14 days ago. Linger cough, which turned into a sore throat.   Vaccines: COVID x2 and no Flu.  HPI: Sinusitis This is a new problem. The current episode started 1 to 4 weeks ago. The problem has been gradually worsening since onset. There has been no fever. His pain is at a severity of 0/10. He is experiencing no pain. Associated symptoms include congestion, coughing, ear pain, a hoarse voice, sneezing and a sore throat. Pertinent negatives include no headaches, shortness of breath, sinus pressure or swollen glands. Treatments tried: Cold and flu, cough meds, and day and nyquil, zinc. The treatment provided mild relief.   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    Allergies: No Known Allergies Medications:  Current Outpatient Medications:    Syringe/Needle, Disp, (SYRINGE 3CC/22GX1") 22G X 1" 3 ML MISC, 1 application by Does not apply route every 7 (seven) days., Disp: 50 each, Rfl: 3   testosterone cypionate (DEPOTESTOSTERONE CYPIONATE) 200 MG/ML injection, ADMINISTER 1 ML(200 MG) IN THE MUSCLE EVERY  7 DAYS, Disp: 10 mL, Rfl: 1  Observations/Objective: Patient is well-developed, well-nourished in no acute distress.  Resting comfortably  at home.  Head is normocephalic, atraumatic.  No labored breathing.  Speech is clear and coherent with logical content.  Patient is alert and oriented at baseline.    Assessment and Plan:  1. Acute bacterial sinusitis S&S are consistent with sinus infection post viral cold Will Tx as OTC not improving S&S Augmentin provided- advised to continue some OTC measures   Reviewed side  effects, risks and benefits of medication.    Patient acknowledged agreement and understanding of the plan.     - amoxicillin-clavulanate (AUGMENTIN) 875-125 MG tablet; Take 1 tablet by mouth 2 (two) times daily for 7 days.  Dispense: 14 tablet; Refill: 0    2. Post-viral cough syndrome Linger cough- no desire for Rx- will continue OTC at this time. No red flags- breathing and talking well during visit.    Follow Up Instructions: I discussed the assessment and treatment plan with the patient. The patient was provided an opportunity to ask questions and all were answered. The patient agreed with the plan and demonstrated an understanding of the instructions.  A copy of instructions were sent to the patient via MyChart unless otherwise noted below.     The patient was advised to call back or seek an in-person evaluation if the symptoms worsen or if the condition fails to improve as anticipated.  Time:  I spent 10 minutes with the patient via telehealth technology discussing the above problems/concerns.    [name], NP

## 2021-07-07 NOTE — Patient Instructions (Signed)

## 2021-08-31 ENCOUNTER — Other Ambulatory Visit: Payer: Self-pay | Admitting: Family Medicine

## 2021-12-27 ENCOUNTER — Telehealth: Payer: Self-pay | Admitting: Physician Assistant

## 2021-12-27 DIAGNOSIS — J208 Acute bronchitis due to other specified organisms: Secondary | ICD-10-CM

## 2021-12-27 DIAGNOSIS — B9689 Other specified bacterial agents as the cause of diseases classified elsewhere: Secondary | ICD-10-CM

## 2021-12-27 MED ORDER — BENZONATATE 100 MG PO CAPS: 100.0000 mg | ORAL_CAPSULE | Freq: Three times a day (TID) | ORAL | 0 refills | Status: DC | PRN

## 2021-12-27 MED ORDER — AZITHROMYCIN 250 MG PO TABS: ORAL_TABLET | ORAL | 0 refills | Status: AC

## 2021-12-27 MED ORDER — PSEUDOEPH-BROMPHEN-DM 30-2-10 MG/5ML PO SYRP: 5.0000 mL | ORAL_SOLUTION | Freq: Four times a day (QID) | ORAL | 0 refills | Status: DC | PRN

## 2021-12-27 NOTE — Progress Notes (Signed)
Virtual Visit Consent   Philip Nguyen, you are scheduled for a virtual visit with a [facility] provider today. Just as with appointments in the office, your consent must be obtained to participate. Your consent will be active for this visit and any virtual visit you may have with one of our providers in the next 365 days. If you have a MyChart account, a copy of this consent can be sent to you electronically.  As this is a virtual visit, video technology does not allow for your provider to perform a traditional examination. This may limit your provider's ability to fully assess your condition. If your provider identifies any concerns that need to be evaluated in person or the need to arrange testing (such as labs, EKG, etc.), we will make arrangements to do so. Although advances in technology are sophisticated, we cannot ensure that it will always work on either your end or our end. If the connection with a video visit is poor, the visit may have to be switched to a telephone visit. With either a video or telephone visit, we are not always able to ensure that we have a secure connection.  By engaging in this virtual visit, you consent to the provision of healthcare and authorize for your insurance to be billed (if applicable) for the services provided during this visit. Depending on your insurance coverage, you may receive a charge related to this service.  I need to obtain your verbal consent now. Are you willing to proceed with your visit today? Philip Nguyen has provided verbal consent on 12/27/2021 for a virtual visit (video or telephone). Margaretann Loveless, PA-C  Date: 12/27/2021 3:24 PM  Insurance: [employer] Member ID: [id number] Group #: [id number]   Virtual Visit via Video Note   I, Margaretann Loveless, connected with  Philip Nguyen  ([id number], 21-Oct-1990) on 12/27/21 at  3:15 PM EDT by a video-enabled telemedicine application and verified that I  am speaking with the correct person using two identifiers.  Location: Patient: Virtual Visit Location Patient: Home Provider: Virtual Visit Location Provider: Home Office   I discussed the limitations of evaluation and management by telemedicine and the availability of in person appointments. The patient expressed understanding and agreed to proceed.    History of Present Illness: Philip Nguyen is a 31 y.o. who identifies as a male who was assigned male at birth, and is being seen today for cough.  HPI: Cough This is a new problem. The current episode started in the past 7 days. The problem has been gradually worsening. The problem occurs constantly. The cough is Non-productive (was productive in the beginning now dry). Associated symptoms include chest pain (tightness), chills, headaches, nasal congestion and postnasal drip. Pertinent negatives include no ear congestion, ear pain, fever, myalgias, rhinorrhea, sore throat, shortness of breath or wheezing. The symptoms are aggravated by lying down. Treatments tried: robitussin, dayquil, sudafed. The treatment provided no relief. There is no history of asthma, bronchitis or environmental allergies.      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    Allergies: No Known Allergies Medications:  Current Outpatient Medications:    azithromycin (ZITHROMAX) 250 MG tablet, Take 2 tablets on day 1, then 1 tablet daily on days 2 through 5, Disp: 6 tablet, Rfl: 0   benzonatate (TESSALON) 100 MG capsule, Take 1 capsule (100 mg total) by mouth 3 (three) times daily as needed., Disp: 30  capsule, Rfl: 0   brompheniramine-pseudoephedrine-DM 30-2-10 MG/5ML syrup, Take 5 mLs by mouth 4 (four) times daily as needed., Disp: 120 mL, Rfl: 0   Syringe/Needle, Disp, (SYRINGE 3CC/22GX1") 22G X 1" 3 ML MISC, 1 application by Does not apply route every 7 (seven) days., Disp: 50 each, Rfl: 3    testosterone cypionate (DEPOTESTOSTERONE CYPIONATE) 200 MG/ML injection, ADMINISTER 1 ML(200 MG) IN THE MUSCLE EVERY 7 DAYS, Disp: 10 mL, Rfl: 5  Observations/Objective: Patient is well-developed, well-nourished in no acute distress.  Resting comfortably at home.  Head is normocephalic, atraumatic.  No labored breathing.  Speech is clear and coherent with logical content.  Patient is alert and oriented at baseline.    Assessment and Plan: 1. Acute bacterial bronchitis - azithromycin (ZITHROMAX) 250 MG tablet; Take 2 tablets on day 1, then 1 tablet daily on days 2 through 5  Dispense: 6 tablet; Refill: 0 - brompheniramine-pseudoephedrine-DM 30-2-10 MG/5ML syrup; Take 5 mLs by mouth 4 (four) times daily as needed.  Dispense: 120 mL; Refill: 0 - benzonatate (TESSALON) 100 MG capsule; Take 1 capsule (100 mg total) by mouth 3 (three) times daily as needed.  Dispense: 30 capsule; Refill: 0  - Worsening over a week despite OTC medications - Will treat with Z-pack, Bromfed DM and tessalon perles - Push fluids.  - Rest.  - Steam and humidifier can help - Seek in person evaluation if worsening or symptoms fail to improve    Follow Up Instructions: I discussed the assessment and treatment plan with the patient. The patient was provided an opportunity to ask questions and all were answered. The patient agreed with the plan and demonstrated an understanding of the instructions.  A copy of instructions were sent to the patient via MyChart unless otherwise noted below.    The patient was advised to call back or seek an in-person evaluation if the symptoms worsen or if the condition fails to improve as anticipated.  Time:  I spent 12 minutes with the patient via telehealth technology discussing the above problems/concerns.    Margaretann Loveless, PA-C

## 2021-12-27 NOTE — Patient Instructions (Signed)
Philip Nguyen, thank you for joining Margaretann Loveless, PA-C for today's virtual visit.  While this provider is not your primary care provider (PCP), if your PCP is located in our provider database this encounter information will be shared with them immediately following your visit.  Consent: (Patient) Philip Nguyen provided verbal consent for this virtual visit at the beginning of the encounter.  Current Medications:  Current Outpatient Medications:    azithromycin (ZITHROMAX) 250 MG tablet, Take 2 tablets on day 1, then 1 tablet daily on days 2 through 5, Disp: 6 tablet, Rfl: 0   benzonatate (TESSALON) 100 MG capsule, Take 1 capsule (100 mg total) by mouth 3 (three) times daily as needed., Disp: 30 capsule, Rfl: 0   brompheniramine-pseudoephedrine-DM 30-2-10 MG/5ML syrup, Take 5 mLs by mouth 4 (four) times daily as needed., Disp: 120 mL, Rfl: 0   Syringe/Needle, Disp, (SYRINGE 3CC/22GX1") 22G X 1" 3 ML MISC, 1 application by Does not apply route every 7 (seven) days., Disp: 50 each, Rfl: 3   testosterone cypionate (DEPOTESTOSTERONE CYPIONATE) 200 MG/ML injection, ADMINISTER 1 ML(200 MG) IN THE MUSCLE EVERY 7 DAYS, Disp: 10 mL, Rfl: 5   Medications ordered in this encounter:  Meds ordered this encounter  Medications   azithromycin (ZITHROMAX) 250 MG tablet    Sig: Take 2 tablets on day 1, then 1 tablet daily on days 2 through 5    Dispense:  6 tablet    Refill:  0    Order Specific Question:   Supervising Provider    Answer:   Eber Hong [[id number]]   brompheniramine-pseudoephedrine-DM 30-2-10 MG/5ML syrup    Sig: Take 5 mLs by mouth 4 (four) times daily as needed.    Dispense:  120 mL    Refill:  0    Order Specific Question:   Supervising Provider    Answer:   MILLER, BRIAN [[id number]]   benzonatate (TESSALON) 100 MG capsule    Sig: Take 1 capsule (100 mg total) by mouth 3 (three) times daily as needed.    Dispense:  30 capsule    Refill:  0    Order Specific  Question:   Supervising Provider    Answer:   Hyacinth Meeker, BRIAN [[id number]]     *If you need refills on other medications prior to your next appointment, please contact your pharmacy*  Follow-Up: Call back or seek an in-person evaluation if the symptoms worsen or if the condition fails to improve as anticipated.  Other Instructions Acute Bronchitis, Adult  Acute bronchitis is sudden inflammation of the main airways (bronchi) that come off the windpipe (trachea) in the lungs. The swelling causes the airways to get smaller and make more mucus than normal. This can make it hard to breathe and can cause coughing or noisy breathing (wheezing). Acute bronchitis may last several weeks. The cough may last longer. Allergies, asthma, and exposure to smoke may make the condition worse. What are the causes? This condition can be caused by germs and by substances that irritate the lungs, including: Cold and flu viruses. The most common cause of this condition is the virus that causes the common cold. Bacteria. This is less common. Breathing in substances that irritate the lungs, including: Smoke from cigarettes and other forms of tobacco. Dust and pollen. Fumes from household cleaning products, gases, or burned fuel. Indoor or outdoor air pollution. What increases the risk? The following factors may make you more likely to develop this condition: A weak body's defense system,  also called the immune system. A condition that affects your lungs and breathing, such as asthma. What are the signs or symptoms? Common symptoms of this condition include: Coughing. This may bring up clear, yellow, or green mucus from your lungs (sputum). Wheezing. Runny or stuffy nose. Having too much mucus in your lungs (chest congestion). Shortness of breath. Aches and pains, including sore throat or chest. How is this diagnosed? This condition is usually diagnosed based on: Your symptoms and medical history. A physical  exam. You may also have other tests, including tests to rule out other conditions, such as pneumonia. These tests include: A test of lung function. Test of a mucus sample to look for the presence of bacteria. Tests to check the oxygen level in your blood. Blood tests. Chest X-ray. How is this treated? Most cases of acute bronchitis clear up over time without treatment. Your health care provider may recommend: Drinking more fluids to help thin your mucus so it is easier to cough up. Taking inhaled medicine (inhaler) to improve air flow in and out of your lungs. Using a vaporizer or a humidifier. These are machines that add water to the air to help you breathe better. Taking a medicine that thins mucus and clears congestion (expectorant). Taking a medicine that prevents or stops coughing (cough suppressant). It is notcommon to take an antibiotic medicine for this condition. Follow these instructions at home:  Take over-the-counter and prescription medicines only as told by your health care provider. Use an inhaler, vaporizer, or humidifier as told by your health care provider. Take two teaspoons (10 mL) of honey at bedtime to lessen coughing at night. Drink enough fluid to keep your urine pale yellow. Do not use any products that contain nicotine or tobacco. These products include cigarettes, chewing tobacco, and vaping devices, such as e-cigarettes. If you need help quitting, ask your health care provider. Get plenty of rest. Return to your normal activities as told by your health care provider. Ask your health care provider what activities are safe for you. Keep all follow-up visits. This is important. How is this prevented? To lower your risk of getting this condition again: Wash your hands often with soap and water for at least 20 seconds. If soap and water are not available, use hand sanitizer. Avoid contact with people who have cold symptoms. Try not to touch your mouth, nose, or  eyes with your hands. Avoid breathing in smoke or chemical fumes. Breathing smoke or chemical fumes will make your condition worse. Get the flu shot every year. Contact a health care provider if: Your symptoms do not improve after 2 weeks. You have trouble coughing up the mucus. Your cough keeps you awake at night. You have a fever. Get help right away if you: Cough up blood. Feel pain in your chest. Have severe shortness of breath. Faint or keep feeling like you are going to faint. Have a severe headache. Have a fever or chills that get worse. These symptoms may represent a serious problem that is an emergency. Do not wait to see if the symptoms will go away. Get medical help right away. Call your local emergency services (911 in the [place] Do not drive yourself to the hospital. Summary Acute bronchitis is inflammation of the main airways (bronchi) that come off the windpipe (trachea) in the lungs. The swelling causes the airways to get smaller and make more mucus than normal. Drinking more fluids can help thin your mucus so it is easier to  cough up. Take over-the-counter and prescription medicines only as told by your health care provider. Do not use any products that contain nicotine or tobacco. These products include cigarettes, chewing tobacco, and vaping devices, such as e-cigarettes. If you need help quitting, ask your health care provider. Contact a health care provider if your symptoms do not improve after 2 weeks. This information is not intended to replace advice given to you by your health care provider. Make sure you discuss any questions you have with your health care provider. Document Revised: 09/21/2020 Document Reviewed: 09/21/2020 Elsevier Patient Education  2023 [employer]vier [employer]    If you have been instructed to have an in-person evaluation today at a local Urgent Care facility, please use the link below. It will take you to a list of all of our available [facility], including address, phone number and hours of operation. Please do not delay care.  [facility]  If you or a family member do not have a primary care provider, use the link below to schedule a visit and establish care. When you choose a [facility] primary care physician or advanced practice provider, you gain a long-term partner in health. Find a Primary Care Provider  Learn more about [facility] in-office and virtual care options: [facility] - Get Care Now

## 2022-01-16 ENCOUNTER — Encounter (HOSPITAL_COMMUNITY): Payer: Self-pay | Admitting: Emergency Medicine

## 2022-01-16 ENCOUNTER — Other Ambulatory Visit: Payer: Self-pay

## 2022-01-16 ENCOUNTER — Emergency Department (HOSPITAL_COMMUNITY)
Admission: EM | Admit: 2022-01-16 | Discharge: 2022-01-17 | Disposition: A | Discharge status: Home / Self Care | Payer: 59 | Attending: Student | Admitting: Student

## 2022-01-16 DIAGNOSIS — R519 Headache, unspecified: Secondary | ICD-10-CM | POA: Insufficient documentation

## 2022-01-16 DIAGNOSIS — I10 Essential (primary) hypertension: Secondary | ICD-10-CM | POA: Diagnosis not present

## 2022-01-16 DIAGNOSIS — H538 Other visual disturbances: Secondary | ICD-10-CM | POA: Insufficient documentation

## 2022-01-16 DIAGNOSIS — R7989 Other specified abnormal findings of blood chemistry: Secondary | ICD-10-CM | POA: Insufficient documentation

## 2022-01-16 DIAGNOSIS — I161 Hypertensive emergency: Secondary | ICD-10-CM | POA: Diagnosis not present

## 2022-01-16 DIAGNOSIS — Z79899 Other long term (current) drug therapy: Secondary | ICD-10-CM | POA: Insufficient documentation

## 2022-01-16 NOTE — ED Triage Notes (Signed)
Pt in with hypertension and headache since this afternoon. States HTN hx in his 20's, and he was able to stop meds after a few yrs. Currently not on any medications

## 2022-01-17 ENCOUNTER — Emergency Department (HOSPITAL_COMMUNITY): Payer: 59

## 2022-01-17 DIAGNOSIS — I161 Hypertensive emergency: Secondary | ICD-10-CM | POA: Diagnosis not present

## 2022-01-17 LAB — BASIC METABOLIC PANEL
Anion gap: 8 (ref 5–15)
BUN: 19 mg/dL (ref 6–20)
CO2: 26 mmol/L (ref 22–32)
Calcium: 9.6 mg/dL (ref 8.9–10.3)
Chloride: 102 mmol/L (ref 98–111)
Creatinine, Ser: 1.25 mg/dL — ABNORMAL HIGH (ref 0.61–1.24)
GFR, Estimated: >60 mL/min (ref >60)
Glucose, Bld: 103 mg/dL — ABNORMAL HIGH (ref 70–99)
Potassium: 4.2 mmol/L (ref 3.5–5.1)
Sodium: 136 mmol/L (ref 135–145)

## 2022-01-17 LAB — CBC WITH DIFFERENTIAL/PLATELET
Abs Immature Granulocytes: 0.02 10*3/uL (ref 0.00–0.07)
Basophils Absolute: 0.1 10*3/uL (ref 0.0–0.1)
Basophils Relative: 1 %
Eosinophils Absolute: 0.2 10*3/uL (ref 0.0–0.5)
Eosinophils Relative: 4 %
HCT: 52.1 % — ABNORMAL HIGH (ref 39.0–52.0)
Hemoglobin: 16.7 g/dL (ref 13.0–17.0)
Immature Granulocytes: 0 %
Lymphocytes Relative: 42 %
Lymphs Abs: 2.6 10*3/uL (ref 0.7–4.0)
MCH: 28 pg (ref 26.0–34.0)
MCHC: 32.1 g/dL (ref 30.0–36.0)
MCV: 87.4 fL (ref 80.0–100.0)
Monocytes Absolute: 0.4 10*3/uL (ref 0.1–1.0)
Monocytes Relative: 6 %
Neutro Abs: 3 10*3/uL (ref 1.7–7.7)
Neutrophils Relative %: 47 %
Platelets: 215 10*3/uL (ref 150–400)
RBC: 5.96 MIL/uL — ABNORMAL HIGH (ref 4.22–5.81)
RDW: 13 % (ref 11.5–15.5)
WBC: 6.3 10*3/uL (ref 4.0–10.5)
nRBC: 0 % (ref 0.0–0.2)

## 2022-01-17 LAB — URINALYSIS, ROUTINE W REFLEX MICROSCOPIC
Bacteria, UA: NONE SEEN
Bilirubin Urine: NEGATIVE
Glucose, UA: NEGATIVE mg/dL
Ketones, ur: NEGATIVE mg/dL
Leukocytes,Ua: NEGATIVE
Nitrite: NEGATIVE
Protein, ur: NEGATIVE mg/dL
Specific Gravity, Urine: 1.028 (ref 1.005–1.030)
pH: 6 (ref 5.0–8.0)

## 2022-01-17 LAB — TROPONIN I (HIGH SENSITIVITY): Troponin I (High Sensitivity): 9 ng/L (ref <18)

## 2022-01-17 MED ORDER — IBUPROFEN 200 MG PO TABS
600.0000 mg | ORAL_TABLET | Freq: Once | ORAL | Status: AC
  Administered 2022-01-17: 600 mg via ORAL
  Filled 2022-01-17: qty 1

## 2022-01-17 MED ORDER — AMLODIPINE BESYLATE 2.5 MG PO TABS: 2.5000 mg | ORAL_TABLET | Freq: Every day | ORAL | 0 refills | Status: DC

## 2022-01-17 NOTE — ED Provider Notes (Signed)
[facility] EMERGENCY DEPARTMENT Provider Note   CSN: [id number] Arrival date & time: 01/16/22  2246     History  Chief Complaint  Patient presents with   Headache   Hypertension    Philip Nguyen is a 31 y.o. male who presents with concern for headache that started around 4:00 this afternoon and notable hypertension at home.  Had history of hypertension that previously required medication, or with lifestyle modifications he was able to get off of this medication.  Is not been on for approximately 9 years.  Endorsed some blurry vision transiently with his headache.  No chest pain or shortness of breath. I personally reviewed the patient's medical record.  With history of hypogonadism, otherwise unremarkable.  He is on testosterone injections weekly, otherwise no prescription medications.  HPI     Home Medications Prior to Admission medications   Medication Sig Start Date End Date Taking? Authorizing Provider  amLODipine (NORVASC) 2.5 MG tablet Take 1 tablet (2.5 mg total) by mouth daily. 01/17/22 02/16/22 Yes Emberley Kral, Lupe Carney R, PA-C  benzonatate (TESSALON) 100 MG capsule Take 1 capsule (100 mg total) by mouth 3 (three) times daily as needed. 12/27/21   Margaretann Loveless, PA-C  brompheniramine-pseudoephedrine-DM 30-2-10 MG/5ML syrup Take 5 mLs by mouth 4 (four) times daily as needed. 12/27/21   Margaretann Loveless, PA-C  Syringe/Needle, Disp, (SYRINGE 3CC/22GX1") 22G X 1" 3 ML MISC 1 application by Does not apply route every 7 (seven) days. 02/15/21   Nelwyn Salisbury, MD  testosterone cypionate (DEPOTESTOSTERONE CYPIONATE) 200 MG/ML injection ADMINISTER 1 ML(200 MG) IN THE MUSCLE EVERY 7 DAYS 08/31/21   Nelwyn Salisbury, MD      Allergies    Patient has no known allergies.    Review of Systems   Review of Systems  Eyes:  Positive for visual disturbance.  Neurological:  Positive for headaches.  All other systems reviewed and are negative.   Physical  Exam Updated Vital Signs BP (!) 153/96 (BP Location: Right Arm)   Pulse 67   Temp 97.9 F (36.6 C) (Oral)   Resp 17   Wt 97.5 kg   SpO2 98%   BMI 28.37 kg/m  Physical Exam Vitals and nursing note reviewed.  Constitutional:      Appearance: He is not ill-appearing or toxic-appearing.  HENT:     Head: Normocephalic and atraumatic.     Mouth/Throat:     Mouth: Mucous membranes are moist.     Pharynx: No oropharyngeal exudate or posterior oropharyngeal erythema.  Eyes:     General:        Right eye: No discharge.        Left eye: No discharge.     Extraocular Movements: Extraocular movements intact.     Conjunctiva/sclera: Conjunctivae normal.     Pupils: Pupils are equal, round, and reactive to light.  Cardiovascular:     Rate and Rhythm: Normal rate and regular rhythm.     Pulses: Normal pulses.     Heart sounds: Normal heart sounds.  Pulmonary:     Effort: Pulmonary effort is normal. No respiratory distress.     Breath sounds: Normal breath sounds. No wheezing or rales.  Abdominal:     General: Bowel sounds are normal. There is no distension.     Palpations: Abdomen is soft.     Tenderness: There is no abdominal tenderness.  Musculoskeletal:        General: No swelling or deformity.  Cervical back: Neck supple.  Skin:    General: Skin is warm and dry.     Capillary Refill: Capillary refill takes less than 2 seconds.  Neurological:     Mental Status: He is alert. Mental status is at baseline.     GCS: GCS eye subscore is 4. GCS verbal subscore is 5. GCS motor subscore is 6.     Cranial Nerves: Cranial nerves 2-12 are intact.     Sensory: Sensation is intact.     Motor: Motor function is intact.     Coordination: Coordination is intact.     Gait: Gait is intact.  Psychiatric:        Mood and Affect: Mood normal.     ED Results / Procedures / Treatments   Labs (all labs ordered are listed, but only abnormal results are displayed) Labs Reviewed  CBC WITH  DIFFERENTIAL/PLATELET - Abnormal; Notable for the following components:      Result Value   RBC 5.96 (*)    HCT 52.1 (*)    All other components within normal limits  BASIC METABOLIC PANEL - Abnormal; Notable for the following components:   Glucose, Bld 103 (*)    Creatinine, Ser 1.25 (*)    All other components within normal limits  URINALYSIS, ROUTINE W REFLEX MICROSCOPIC - Abnormal; Notable for the following components:   Hgb urine dipstick SMALL (*)    All other components within normal limits  TROPONIN I (HIGH SENSITIVITY)  TROPONIN I (HIGH SENSITIVITY)    EKG None  Radiology CT HEAD WO CONTRAST ( )  Result Date: 01/17/2022 CLINICAL DATA:  Hypertensive emergency. EXAM: CT HEAD WITHOUT CONTRAST TECHNIQUE: Contiguous axial images were obtained from the base of the skull through the vertex without intravenous contrast. RADIATION DOSE REDUCTION: This exam was performed according to the departmental dose-optimization program which includes automated exposure control, adjustment of the mA and/or kV according to patient size and/or use of iterative reconstruction technique. COMPARISON:  None Available. FINDINGS: Brain: No acute intracranial hemorrhage, midline shift or mass effect. No extra-axial fluid collection. Gray-white matter differentiation is within normal limits. No hydrocephalus. Vascular: No hyperdense vessel or unexpected calcification. Skull: Normal. Negative for fracture or focal lesion. Sinuses/Orbits: No acute finding. Other: None. IMPRESSION: No acute intracranial process. Electronically Signed   By: Thornell Sartorius M.D.   On: 01/17/2022 01:41    Procedures Procedures    Medications Ordered in ED Medications  ibuprofen (ADVIL) tablet 600 mg (600 mg Oral Given 01/17/22 0037)    ED Course/ Medical Decision Making/ A&P                           Medical Decision Making 31 year old male presents with concern for headache with some vision changes and elevated blood  pressure.  Hypertensive on intake to 160/106.  Otherwise vital signs are normal.  Cardiopulmonary abdominal exams are benign.  Patient is neurologically intact without focal deficit on neuro exam.  Emergent considerations for headache include subarachnoid hemorrhage, meningitis, temporal arteritis, glaucoma, cerebral ischemia, carotid/vertebral dissection, intracranial tumor, Venous sinus thrombosis, carbon monoxide poisoning, acute or chronic subdural hemorrhage.  Other considerations include: Migraine, Cluster headache, Tension headache, Hypertension, Caffeine / alcohol / drug withdrawal, Pseudotumor cerebri, Arteriovenous malformation, Head injury, Neurocysticercosis, Post-lumbar puncture, Preeclampsia, Cervical arthritis, Refractive error causing strain, Dental abscess, Sinusitis, Otitis media, Temporomandibular joint syndrome, Depression, Somatoform disorder (eg, somatization) Trigeminal neuralgia, Glossopharyngeal neuralgia.   Amount and/or Complexity of Data Reviewed Labs:  ordered.    Details: CBC without leukocytosis or anemia, BMP with mildly elevated creatinine 1.2, near patient's baseline.  UA without proteinuria but with small hemoglobinuria.  Troponin is normal. Radiology: ordered.    Details: CT of the head is negative for acute cranial abnormality.  Risk Prescription drug management.    Patient reevaluated after administration of ibuprofen with improvement in his headache.  If he continues to have headache that is improved to a greater degree than with Tylenol at home.  When given the choice of remaining in the ED for migraine cocktail versus discharge home, patient preferred to be discharged at this time.  Feel this is reasonable given reassuring work-up and improvement in headache.  It is difficult to say whether patient's headache is secondary to his hypertension or whether his hypertension is in response to the pain he is experiencing with his headache.  We will provide very  low-dose amlodipine prescription should patient's blood pressure remain elevated.  Recommend close outpatient follow-up with his PCP.  No further work-up warranted near this time.  Clinical concern for emergent underlying etiology would warrant further ED work-up or inpatient management is exceedingly low.  Jaysten  voiced understanding of his medical evaluation and treatment plan. Each of their questions answered to their expressed satisfaction.  Return precautions were given.  Patient is well-appearing, stable, and was discharged in good condition.   This chart was dictated using voice recognition software, Dragon. Despite the best efforts of this provider to proofread and correct errors, errors may still occur which can change documentation meaning.   Final Clinical Impression(s) / ED Diagnoses Final diagnoses:  Hypertension, unspecified type  Acute nonintractable headache, unspecified headache type    Rx / DC Orders ED Discharge Orders          Ordered    amLODipine (NORVASC) 2.5 MG tablet  Daily        01/17/22 0310              Leatrice Parilla, Eugene Gavia, PA-C 01/17/22 0507    Palumbo, April, MD 01/17/22 [phone]

## 2022-01-17 NOTE — Discharge Instructions (Addendum)
You were seen in the ER today for your headache. Your blood work, physical exam, and CT scan were very reassuring.  There is no bleeding in your brain there is no sign of any acute injury to organs from your blood pressure.  It is possible your blood pressure is elevated because of your headache.  You may continue to alternate Tylenol and ibuprofen at home as needed.  Additionally please track your blood pressure once every morning and evening.  If remains elevated you may begin the low-dose blood pressure medication prescribed to your pharmacy.  Please follow-up closely with your primary care doctor and return to the ER with any new severe symptoms.

## 2022-01-17 NOTE — ED Notes (Signed)
Patient verbalizes understanding of discharge instructions. Opportunity for questioning and answers were provided. Armband removed by staff, pt discharged from ED. Ambulated out to lobby  

## 2022-01-17 NOTE — ED Provider Triage Note (Signed)
Emergency Medicine Provider Triage Evaluation Note  Philip Nguyen , a 31 y.o. male  was evaluated in triage.  Pt complains of severe frontal headache with pressure behind the eyes and sensation that his body is very drained that started around 4 PM today.  He took his blood pressure and it was significantly elevated at home.  History of hypertension in the past that required treatment with lisinopril, however has been able to make lifestyle modifications and was able to get off the medications 9 years ago.  Not currently on any medications for his BP.  Review of Systems  Positive: Headache, elevated blood pressure, blurry vision Negative: Double vision, syncope, chest pain, decreased urination  Physical Exam  BP (!) 169/106 (BP Location: Right Arm)   Pulse 74   Temp (!) 97.5 F (36.4 C) (Oral)   Resp 19   Wt 97.5 kg   SpO2 100%   BMI 28.37 kg/m  Gen:   Awake, no distress   Resp:  Normal effort  MSK:   Moves extremities without difficulty  Other:  PERRL, EOMI.  RRR no M/R/..  Lungs CTA B.  GCS 15  Medical Decision Making  Medically screening exam initiated at 12:06 AM.  Appropriate orders placed.  Holton Sidman was informed that the remainder of the evaluation will be completed by another provider, this initial triage assessment does not replace that evaluation, and the importance of remaining in the ED until their evaluation is complete.  Work-up initiated.   Paris Lore, PA-C 01/17/22 0015

## 2022-04-17 ENCOUNTER — Telehealth: Payer: 59 | Admitting: Physician Assistant

## 2022-04-17 DIAGNOSIS — J069 Acute upper respiratory infection, unspecified: Secondary | ICD-10-CM

## 2022-04-17 MED ORDER — BENZONATATE 100 MG PO CAPS: 100.0000 mg | ORAL_CAPSULE | Freq: Three times a day (TID) | ORAL | 0 refills | Status: DC | PRN

## 2022-04-17 NOTE — Progress Notes (Signed)
Virtual Visit Consent   [name], you are scheduled for a virtual visit with a [facility] provider today. Just as with appointments in the office, your consent must be obtained to participate. Your consent will be active for this visit and any virtual visit you may have with one of our providers in the next 365 days. If you have a MyChart account, a copy of this consent can be sent to you electronically.  As this is a virtual visit, video technology does not allow for your provider to perform a traditional examination. This may limit your provider's ability to fully assess your condition. If your provider identifies any concerns that need to be evaluated in person or the need to arrange testing (such as labs, EKG, etc.), we will make arrangements to do so. Although advances in technology are sophisticated, we cannot ensure that it will always work on either your end or our end. If the connection with a video visit is poor, the visit may have to be switched to a telephone visit. With either a video or telephone visit, we are not always able to ensure that we have a secure connection.  By engaging in this virtual visit, you consent to the provision of healthcare and authorize for your insurance to be billed (if applicable) for the services provided during this visit. Depending on your insurance coverage, you may receive a charge related to this service.  I need to obtain your verbal consent now. Are you willing to proceed with your visit today? Philip Nguyen has provided verbal consent on 04/17/2022 for a virtual visit (video or telephone). Philip Nguyen, [place]  Date: 04/17/2022 9:31 AM  Virtual Visit via Video Note   I, Philip Nguyen, connected with  [name]  ([id number], 10/09/1990) on 04/17/22 at  9:30 AM EST by a video-enabled telemedicine application and verified that I am speaking with the correct person using two  identifiers.  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 The patient expressed understanding and agreed to proceed.    History of Present Illness: Philip Nguyen is a 31 y.o. who identifies as a male who was assigned male at birth, and is being seen today for 1.5 days of dry cough, scratchy throat and some ear pressure. Denies fever, chills, ear pain, sinus pain, tooth pain, chest pain or SOB. Denies any significant nasal congestion. Denies recent travel. Mother and fiancee both with similar s ymptoms last week. Better now. No known exposure to COVID. Is taking Dayquil OTC.   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    Allergies: No Known Allergies Medications:  Current Outpatient Medications:    benzonatate (TESSALON) 100 MG capsule, Take 1 capsule (100 mg total) by mouth 3 (three) times daily as needed for cough., Disp: 30 capsule, Rfl: 0   amLODipine (NORVASC) 2.5 MG tablet, Take 1 tablet (2.5 mg total) by mouth daily., Disp: 30 tablet, Rfl: 0   Syringe/Needle, Disp, (SYRINGE 3CC/22GX1") 22G X 1" 3 ML MISC, 1 application by Does not apply route every 7 (seven) days., Disp: 50 each, Rfl: 3   testosterone cypionate (DEPOTESTOSTERONE CYPIONATE) 200 MG/ML injection, ADMINISTER 1 ML(200 MG) IN THE MUSCLE EVERY 7 DAYS, Disp: 10 mL, Rfl: 5  Observations/Objective:  Patient is well-developed, well-nourished in no acute distress.  Resting comfortably at home.  Head is normocephalic, atraumatic.  No labored breathing. Speech is clear and coherent with logical content.  Patient is alert and oriented at baseline.   Assessment and Plan: 1. Viral URI with cough - benzonatate (TESSALON) 100 MG capsule; Take 1 capsule (100 mg  total) by mouth 3 (three) times daily as needed for cough.  Dispense: 30 capsule; Refill: 0  < 2 days of milder symptoms Afebrile. No known COVID exposure. Supportive measures, OTC medications and vitamin recommendations reviewed. Tessalon per orders. Work note declined.   Follow Up Instructions: I discussed the assessment and treatment plan with the patient. The patient was provided an opportunity to ask questions and all were answered. The patient agreed with the plan and demonstrated an understanding of the instructions.  A copy of instructions were sent to the patient via MyChart unless otherwise noted below.   The patient was advised to call back or seek an in-person evaluation if the symptoms worsen or if the condition fails to improve as anticipated.  Time:  I spent 10 minutes with the patient via telehealth technology discussing the above problems/concerns.    Philip Climes, PA-C

## 2022-04-17 NOTE — Patient Instructions (Signed)
Windy Canny, thank you for joining [name], PA-C for today's virtual visit.  While this provider is not your primary care provider (PCP), if your PCP is located in our provider database this encounter information will be shared with them immediately following your visit.   A [facility] MyChart account gives you access to today's visit and all your visits, tests, and labs performed at [facility] " click here if you don't have a [facility] MyChart account or go to [email or web address]  Consent: (Patient) Philip Nguyen provided verbal consent for this virtual visit at the beginning of the encounter.  Current Medications:  Current Outpatient Medications:    amLODipine (NORVASC) 2.5 MG tablet, Take 1 tablet (2.5 mg total) by mouth daily., Disp: 30 tablet, Rfl: 0   benzonatate (TESSALON) 100 MG capsule, Take 1 capsule (100 mg total) by mouth 3 (three) times daily as needed., Disp: 30 capsule, Rfl: 0   brompheniramine-pseudoephedrine-DM 30-2-10 MG/5ML syrup, Take 5 mLs by mouth 4 (four) times daily as needed., Disp: 120 mL, Rfl: 0   Syringe/Needle, Disp, (SYRINGE 3CC/22GX1") 22G X 1" 3 ML MISC, 1 application by Does not apply route every 7 (seven) days., Disp: 50 each, Rfl: 3   testosterone cypionate (DEPOTESTOSTERONE CYPIONATE) 200 MG/ML injection, ADMINISTER 1 ML(200 MG) IN THE MUSCLE EVERY 7 DAYS, Disp: 10 mL, Rfl: 5   Medications ordered in this encounter:  No orders of the defined types were placed in this encounter.    *If you need refills on other medications prior to your next appointment, please contact your pharmacy*  Follow-Up: Call back or seek an in-person evaluation if the symptoms worsen or if the condition fails to improve as anticipated.  [facility] [phone]  Other Instructions  We are sorry you are not feeling well.  Here is how we plan to help!  Based on what you have shared with me, it looks like  you may have a viral upper respiratory infection.  Upper respiratory infections are caused by a large number of viruses; however, rhinovirus is the most common cause.   Symptoms vary from person to person, with common symptoms including sore throat, cough, fatigue or lack of energy and feeling of general discomfort.  A low-grade fever of up to 100.4 may present, but is often uncommon.  Symptoms vary however, and are closely related to a person's age or underlying illnesses.  The most common symptoms associated with an upper respiratory infection are nasal discharge or congestion, cough, sneezing, headache and pressure in the ears and face.  These symptoms usually persist for about 3 to 10 days, but can last up to 2 weeks.  It is important to know that upper respiratory infections do not cause serious illness or complications in most cases.    Upper respiratory infections can be transmitted from person to person, with the most common method of transmission being a person's hands.  The virus is able to live on the skin and can infect other persons for up to 2 hours after direct contact.  Also, these can be transmitted when someone coughs or sneezes; thus, it is important to cover the mouth to reduce this risk.  To keep the spread of the illness at bay, good hand hygiene is very important.  This is an infection that is most likely caused by a virus. There are no specific treatments other than to help you with the symptoms until the infection runs its course.  We  are sorry you are not feeling well.  Here is how we plan to help!   For nasal congestion, you may use an oral decongestants such as Mucinex D or if you have glaucoma or high blood pressure use plain Mucinex.  Saline nasal spray or nasal drops can help and can safely be used as often as needed for congestion.  If you do not have a history of heart disease, hypertension, diabetes or thyroid disease, prostate/bladder issues or glaucoma, you may also use  Sudafed to treat nasal congestion.  It is highly recommended that you consult with a pharmacist or your primary care physician to ensure this medication is safe for you to take.     If you have a cough, you may use cough suppressants such as Delsym and Robitussin.  If you have glaucoma or high blood pressure, you can also use Coricidin HBP.   For cough I have prescribed for you A prescription cough medication called Tessalon Perles 100 mg. You may take 1-2 capsules every 8 hours as needed for cough  If you have a sore or scratchy throat, use a saltwater gargle-  to  teaspoon of salt dissolved in a 4-ounce to 8-ounce glass of warm water.  Gargle the solution for approximately 15-30 seconds and then spit.  It is important not to swallow the solution.  You can also use throat lozenges/cough drops and Chloraseptic spray to help with throat pain or discomfort.  Warm or cold liquids can also be helpful in relieving throat pain.  For headache, pain or general discomfort, you can use Ibuprofen or Tylenol as directed.   Some authorities believe that zinc sprays or the use of Echinacea may shorten the course of your symptoms.   HOME CARE Only take medications as instructed by your medical team. Be sure to drink plenty of fluids. Water is fine as well as fruit juices, sodas and electrolyte beverages. You may want to stay away from caffeine or alcohol. If you are nauseated, try taking small sips of liquids. How do you know if you are getting enough fluid? Your urine should be a pale yellow or almost colorless. Get rest. Taking a steamy shower or using a humidifier may help nasal congestion and ease sore throat pain. You can place a towel over your head and breathe in the steam from hot water coming from a faucet. Using a saline nasal spray works much the same way. Cough drops, hard candies and sore throat lozenges may ease your cough. Avoid close contacts especially the very young and the elderly Cover your  mouth if you cough or sneeze Always remember to wash your hands.   GET HELP RIGHT AWAY IF: You develop worsening fever. If your symptoms do not improve within 10 days You develop yellow or green discharge from your nose over 3 days. You have coughing fits You develop a severe head ache or visual changes. You develop shortness of breath, difficulty breathing or start having chest pain Your symptoms persist after you have completed your treatment plan  MAKE SURE YOU  Understand these instructions. Will watch your condition. Will get help right away if you are not doing well or get worse.       If you have been instructed to have an in-person evaluation today at a local Urgent Care facility, please use the link below. It will take you to a list of all of our available [facility], including address, phone number and hours of operation.  Please do not delay care.  [facility]  If you or a family member do not have a primary care provider, use the link below to schedule a visit and establish care. When you choose a [facility] primary care physician or advanced practice provider, you gain a long-term partner in health. Find a Primary Care Provider  Learn more about [facility]'s in-office and virtual care options: [facility]

## 2022-05-13 ENCOUNTER — Other Ambulatory Visit: Payer: Self-pay | Admitting: Family Medicine

## 2022-05-15 ENCOUNTER — Encounter: Payer: Self-pay | Admitting: Family Medicine

## 2022-05-16 MED ORDER — TESTOSTERONE CYPIONATE 200 MG/ML IM SOLN: INTRAMUSCULAR | 0 refills | Status: DC

## 2022-05-16 NOTE — Telephone Encounter (Signed)
He is actually scheduled for Jan 9 at 10:15. I will go ahead and send in one refill of testosterone

## 2022-05-31 ENCOUNTER — Telehealth: Payer: 59 | Admitting: Emergency Medicine

## 2022-05-31 DIAGNOSIS — R6889 Other general symptoms and signs: Secondary | ICD-10-CM | POA: Diagnosis not present

## 2022-05-31 MED ORDER — BENZONATATE 100 MG PO CAPS: 100.0000 mg | ORAL_CAPSULE | Freq: Two times a day (BID) | ORAL | 0 refills | Status: DC | PRN

## 2022-05-31 MED ORDER — OSELTAMIVIR PHOSPHATE 75 MG PO CAPS: 75.0000 mg | ORAL_CAPSULE | Freq: Two times a day (BID) | ORAL | 0 refills | Status: DC

## 2022-05-31 NOTE — Patient Instructions (Signed)
  Windy Canny, thank you for joining Roxy Horseman, PA-C for today's virtual visit.  While this provider is not your primary care provider (PCP), if your PCP is located in our provider database this encounter information will be shared with them immediately following your visit.   A [facility] MyChart account gives you access to today's visit and all your visits, tests, and labs performed at [facility] " click here if you don't have a [facility] MyChart account or go to [email or web address]  Consent: (Patient) Philip Nguyen provided verbal consent for this virtual visit at the beginning of the encounter.  Current Medications:  Current Outpatient Medications:    benzonatate (TESSALON) 100 MG capsule, Take 1 capsule (100 mg total) by mouth 2 (two) times daily as needed for cough., Disp: 20 capsule, Rfl: 0   oseltamivir (TAMIFLU) 75 MG capsule, Take 1 capsule (75 mg total) by mouth 2 (two) times daily., Disp: 10 capsule, Rfl: 0   amLODipine (NORVASC) 2.5 MG tablet, Take 1 tablet (2.5 mg total) by mouth daily., Disp: 30 tablet, Rfl: 0   Syringe/Needle, Disp, (SYRINGE 3CC/22GX1") 22G X 1" 3 ML MISC, 1 application by Does not apply route every 7 (seven) days., Disp: 50 each, Rfl: 3   testosterone cypionate (DEPOTESTOSTERONE CYPIONATE) 200 MG/ML injection, ADMINISTER 1 ML(200 MG) IN THE MUSCLE EVERY 7 DAYS, Disp: 10 mL, Rfl: 0   Medications ordered in this encounter:  Meds ordered this encounter  Medications   oseltamivir (TAMIFLU) 75 MG capsule    Sig: Take 1 capsule (75 mg total) by mouth 2 (two) times daily.    Dispense:  10 capsule    Refill:  0    Order Specific Question:   Supervising Provider    Answer:   Merrilee Jansky [[id number]]   benzonatate (TESSALON) 100 MG capsule    Sig: Take 1 capsule (100 mg total) by mouth 2 (two) times daily as needed for cough.    Dispense:  20 capsule    Refill:  0    Order Specific Question:   Supervising  Provider    Answer:   Merrilee Jansky [id number]     *If you need refills on other medications prior to your next appointment, please contact your pharmacy*  Follow-Up: Call back or seek an in-person evaluation if the symptoms worsen or if the condition fails to improve as anticipated.  [facility] [phone]  Other Instructions    If you have been instructed to have an in-person evaluation today at a local Urgent Care facility, please use the link below. It will take you to a list of all of our available [facility], including address, phone number and hours of operation. Please do not delay care.  [facility]  If you or a family member do not have a primary care provider, use the link below to schedule a visit and establish care. When you choose a [facility] primary care physician or advanced practice provider, you gain a long-term partner in health. Find a Primary Care Provider  Learn more about [facility]'s in-office and virtual care options:  - Get Care Now

## 2022-05-31 NOTE — Progress Notes (Signed)
Virtual Visit Consent   Philip Nguyen, you are scheduled for a virtual visit with a [facility] provider today. Just as with appointments in the office, your consent must be obtained to participate. Your consent will be active for this visit and any virtual visit you may have with one of our providers in the next 365 days. If you have a MyChart account, a copy of this consent can be sent to you electronically.  As this is a virtual visit, video technology does not allow for your provider to perform a traditional examination. This may limit your provider's ability to fully assess your condition. If your provider identifies any concerns that need to be evaluated in person or the need to arrange testing (such as labs, EKG, etc.), we will make arrangements to do so. Although advances in technology are sophisticated, we cannot ensure that it will always work on either your end or our end. If the connection with a video visit is poor, the visit may have to be switched to a telephone visit. With either a video or telephone visit, we are not always able to ensure that we have a secure connection.  By engaging in this virtual visit, you consent to the provision of healthcare and authorize for your insurance to be billed (if applicable) for the services provided during this visit. Depending on your insurance coverage, you may receive a charge related to this service.  I need to obtain your verbal consent now. Are you willing to proceed with your visit today? Philip Nguyen has provided verbal consent on 05/31/2022 for a virtual visit (video or telephone). Roxy Horseman, PA-C  Date: 05/31/2022 9:04 AM  Virtual Visit via Video Note   I, Roxy Horseman, connected with  Philip Nguyen  ([id number], 05-Apr-1991) on 05/31/22 at  9:00 AM EST by a video-enabled telemedicine application and verified that I am speaking with the correct person using two identifiers.  Location: Patient:  Virtual Visit Location Patient: Home Provider: Virtual Visit Location Provider: Home Office   I discussed the limitations of evaluation and management by telemedicine and the availability of in person appointments. The patient expressed understanding and agreed to proceed.    History of Present Illness: Philip Nguyen is a 31 y.o. who identifies as a male who was assigned male at birth, and is being seen today for congestion x 2 days.  Also reports associated cough, generalized body aches.  He's uncertain if he has a fever.  States that his son had a fever last night.   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    Allergies: No Known Allergies Medications:  Current Outpatient Medications:    benzonatate (TESSALON) 100 MG capsule, Take 1 capsule (100 mg total) by mouth 2 (two) times daily as needed for cough., Disp: 20 capsule, Rfl: 0   oseltamivir (TAMIFLU) 75 MG capsule, Take 1 capsule (75 mg total) by mouth 2 (two) times daily., Disp: 10 capsule, Rfl: 0   amLODipine (NORVASC) 2.5 MG tablet, Take 1 tablet (2.5 mg total) by mouth daily., Disp: 30 tablet, Rfl: 0   Syringe/Needle, Disp, (SYRINGE 3CC/22GX1") 22G X 1" 3 ML MISC, 1 application by Does not apply route every 7 (seven) days., Disp: 50 each, Rfl: 3   testosterone cypionate (DEPOTESTOSTERONE CYPIONATE) 200 MG/ML injection, ADMINISTER 1 ML(200 MG) IN THE MUSCLE EVERY 7 DAYS, Disp: 10 mL, Rfl: 0  Observations/Objective: Patient is  well-developed, well-nourished in no acute distress.  Resting comfortably at home.  Head is normocephalic, atraumatic.  No labored breathing. Sounds congested. Speech is clear and coherent with logical content.  Patient is alert and oriented at baseline.    Assessment and Plan: 1. Flu-like symptoms  Symptoms are consistent with influenza.  Will treat with Tamiflu and Tessalon. Supportive care.  Meds ordered this encounter   Medications   oseltamivir (TAMIFLU) 75 MG capsule    Sig: Take 1 capsule (75 mg total) by mouth 2 (two) times daily.    Dispense:  10 capsule    Refill:  0    Order Specific Question:   Supervising Provider    Answer:   Merrilee Jansky [[id number]]   benzonatate (TESSALON) 100 MG capsule    Sig: Take 1 capsule (100 mg total) by mouth 2 (two) times daily as needed for cough.    Dispense:  20 capsule    Refill:  0    Order Specific Question:   Supervising Provider    Answer:   Merrilee Jansky [id number]     Follow Up Instructions: I discussed the assessment and treatment plan with the patient. The patient was provided an opportunity to ask questions and all were answered. The patient agreed with the plan and demonstrated an understanding of the instructions.  A copy of instructions were sent to the patient via MyChart unless otherwise noted below.     The patient was advised to call back or seek an in-person evaluation if the symptoms worsen or if the condition fails to improve as anticipated.  Time:  I spent 11 minutes with the patient via telehealth technology discussing the above problems/concerns.    Roxy Horseman, PA-C

## 2022-06-07 ENCOUNTER — Ambulatory Visit: Payer: 59 | Admitting: Family Medicine

## 2022-06-12 ENCOUNTER — Encounter: Payer: 59 | Admitting: Family Medicine

## 2022-06-18 ENCOUNTER — Ambulatory Visit (INDEPENDENT_AMBULATORY_CARE_PROVIDER_SITE_OTHER): Payer: BC Managed Care – PPO | Admitting: Family Medicine

## 2022-06-18 ENCOUNTER — Encounter: Payer: Self-pay | Admitting: Family Medicine

## 2022-06-18 VITALS — BP 138/80 | HR 76 | Temp 97.9°F | Ht 73.0 in | Wt 219.0 lb

## 2022-06-18 DIAGNOSIS — Z0000 Encounter for general adult medical examination without abnormal findings: Secondary | ICD-10-CM

## 2022-06-18 DIAGNOSIS — E291 Testicular hypofunction: Secondary | ICD-10-CM

## 2022-06-18 NOTE — Progress Notes (Signed)
   Subjective:    Patient ID: Rhodes Calvert, male    DOB: 1990-07-13, 32 y.o.   MRN: [id number]  HPI Here for a well exam. He feels great. He says he often stretches the testosterone out and takes 1?3 of a vial at a time, but it still works well for him.    Review of Systems  Constitutional: Negative.   HENT: Negative.    Eyes: Negative.   Respiratory: Negative.    Cardiovascular: Negative.   Gastrointestinal: Negative.   Genitourinary: Negative.   Musculoskeletal: Negative.   Skin: Negative.   Neurological: Negative.   Psychiatric/Behavioral: Negative.         Objective:   Physical Exam Constitutional:      General: He is not in acute distress.    Appearance: Normal appearance. He is well-developed. He is not diaphoretic.  HENT:     Head: Normocephalic and atraumatic.     Right Ear: External ear normal.     Left Ear: External ear normal.     Nose: Nose normal.     Mouth/Throat:     Pharynx: No oropharyngeal exudate.  Eyes:     General: No scleral icterus.       Right eye: No discharge.        Left eye: No discharge.     Conjunctiva/sclera: Conjunctivae normal.     Pupils: Pupils are equal, round, and reactive to light.  Neck:     Thyroid: No thyromegaly.     Vascular: No JVD.     Trachea: No tracheal deviation.  Cardiovascular:     Rate and Rhythm: Normal rate and regular rhythm.     Heart sounds: Normal heart sounds. No murmur heard.    No friction rub. No gallop.  Pulmonary:     Effort: Pulmonary effort is normal. No respiratory distress.     Breath sounds: Normal breath sounds. No wheezing or rales.  Chest:     Chest wall: No tenderness.  Abdominal:     General: Bowel sounds are normal. There is no distension.     Palpations: Abdomen is soft. There is no mass.     Tenderness: There is no abdominal tenderness. There is no guarding or rebound.  Genitourinary:    Penis: Normal. No tenderness.      Testes: Normal.  Musculoskeletal:         General: No tenderness. Normal range of motion.     Cervical back: Neck supple.  Lymphadenopathy:     Cervical: No cervical adenopathy.  Skin:    General: Skin is warm and dry.     Coloration: Skin is not pale.     Findings: No erythema or rash.  Neurological:     Mental Status: He is alert and oriented to person, place, and time.     Cranial Nerves: No cranial nerve deficit.     Motor: No abnormal muscle tone.     Coordination: Coordination normal.     Deep Tendon Reflexes: Reflexes are normal and symmetric. Reflexes normal.  Psychiatric:        Behavior: Behavior normal.        Thought Content: Thought content normal.        Judgment: Judgment normal.           Assessment & Plan:  Well exam. We discussed diet and exercise. Get fasting labs. Alysia Penna, MD

## 2022-06-26 ENCOUNTER — Other Ambulatory Visit (INDEPENDENT_AMBULATORY_CARE_PROVIDER_SITE_OTHER): Payer: BC Managed Care – PPO

## 2022-06-26 DIAGNOSIS — E291 Testicular hypofunction: Secondary | ICD-10-CM | POA: Diagnosis not present

## 2022-06-26 DIAGNOSIS — Z0000 Encounter for general adult medical examination without abnormal findings: Secondary | ICD-10-CM

## 2022-06-26 LAB — CBC WITH DIFFERENTIAL/PLATELET
Basophils Absolute: 0 10*3/uL (ref 0.0–0.1)
Basophils Relative: 0.7 % (ref 0.0–3.0)
Eosinophils Absolute: 0.2 10*3/uL (ref 0.0–0.7)
Eosinophils Relative: 3.7 % (ref 0.0–5.0)
HCT: 51.7 % (ref 39.0–52.0)
Hemoglobin: 16.8 g/dL (ref 13.0–17.0)
Lymphocytes Relative: 44.9 % (ref 12.0–46.0)
Lymphs Abs: 2.4 10*3/uL (ref 0.7–4.0)
MCHC: 32.6 g/dL (ref 30.0–36.0)
MCV: 84.2 fl (ref 78.0–100.0)
Monocytes Absolute: 0.4 10*3/uL (ref 0.1–1.0)
Monocytes Relative: 7.4 % (ref 3.0–12.0)
Neutro Abs: 2.3 10*3/uL (ref 1.4–7.7)
Neutrophils Relative %: 43.3 % (ref 43.0–77.0)
Platelets: 232 10*3/uL (ref 150.0–400.0)
RBC: 6.14 Mil/uL — ABNORMAL HIGH (ref 4.22–5.81)
RDW: 15.2 % (ref 11.5–15.5)
WBC: 5.3 10*3/uL (ref 4.0–10.5)

## 2022-06-26 LAB — HEPATIC FUNCTION PANEL
ALT: 27 U/L (ref 0–53)
AST: 38 U/L — ABNORMAL HIGH (ref 0–37)
Albumin: 4.1 g/dL (ref 3.5–5.2)
Alkaline Phosphatase: 31 U/L — ABNORMAL LOW (ref 39–117)
Bilirubin, Direct: 0.1 mg/dL (ref 0.0–0.3)
Total Bilirubin: 0.7 mg/dL (ref 0.2–1.2)
Total Protein: 6.7 g/dL (ref 6.0–8.3)

## 2022-06-26 LAB — LIPID PANEL
Cholesterol: 263 mg/dL — ABNORMAL HIGH (ref 0–200)
HDL: 20.2 mg/dL — ABNORMAL LOW (ref >39.00)
LDL Cholesterol: 214 mg/dL — ABNORMAL HIGH (ref 0–99)
NonHDL: 242.41
Total CHOL/HDL Ratio: 13
Triglycerides: 140 mg/dL (ref 0.0–149.0)
VLDL: 28 mg/dL (ref 0.0–40.0)

## 2022-06-26 LAB — BASIC METABOLIC PANEL
BUN: 14 mg/dL (ref 6–23)
CO2: 30 mEq/L (ref 19–32)
Calcium: 9.3 mg/dL (ref 8.4–10.5)
Chloride: 100 mEq/L (ref 96–112)
Creatinine, Ser: 1.19 mg/dL (ref 0.40–1.50)
GFR: 80.03 mL/min (ref >60.00)
Glucose, Bld: 87 mg/dL (ref 70–99)
Potassium: 4.3 mEq/L (ref 3.5–5.1)
Sodium: 138 mEq/L (ref 135–145)

## 2022-06-26 LAB — TSH: TSH: 2.34 u[IU]/mL (ref 0.35–5.50)

## 2022-06-26 LAB — HEMOGLOBIN A1C: Hgb A1c MFr Bld: 5.3 % (ref 4.6–6.5)

## 2022-06-26 LAB — TESTOSTERONE: Testosterone: 364.55 ng/dL (ref 300.00–890.00)

## 2022-11-14 DIAGNOSIS — S53401A Unspecified sprain of right elbow, initial encounter: Secondary | ICD-10-CM | POA: Diagnosis not present

## 2023-02-08 ENCOUNTER — Telehealth: Payer: BC Managed Care – PPO | Admitting: Physician Assistant

## 2023-02-08 DIAGNOSIS — J019 Acute sinusitis, unspecified: Secondary | ICD-10-CM

## 2023-02-08 DIAGNOSIS — B9689 Other specified bacterial agents as the cause of diseases classified elsewhere: Secondary | ICD-10-CM

## 2023-02-08 MED ORDER — AMOXICILLIN-POT CLAVULANATE 875-125 MG PO TABS: 1.0000 | ORAL_TABLET | Freq: Two times a day (BID) | ORAL | 0 refills | Status: DC

## 2023-02-08 MED ORDER — FLUTICASONE PROPIONATE 50 MCG/ACT NA SUSP: 2.0000 | Freq: Every day | NASAL | 0 refills | Status: DC

## 2023-02-08 NOTE — Patient Instructions (Signed)
Windy Canny, thank you for joining Margaretann Loveless, PA-C for today's virtual visit.  While this provider is not your primary care provider (PCP), if your PCP is located in our provider database this encounter information will be shared with them immediately following your visit.   A [facility] MyChart account gives you access to today's visit and all your visits, tests, and labs performed at [facility] " click here if you don't have a [facility] MyChart account or go to [email or web address]  Consent: (Patient) Philip Nguyen provided verbal consent for this virtual visit at the beginning of the encounter.  Current Medications:  Current Outpatient Medications:    amoxicillin-clavulanate (AUGMENTIN) 875-125 MG tablet, Take 1 tablet by mouth 2 (two) times daily., Disp: 14 tablet, Rfl: 0   fluticasone (FLONASE) 50 MCG/ACT nasal spray, Place 2 sprays into both nostrils daily., Disp: 16 g, Rfl: 0   benzonatate (TESSALON) 100 MG capsule, Take 1 capsule (100 mg total) by mouth 2 (two) times daily as needed for cough., Disp: 20 capsule, Rfl: 0   oseltamivir (TAMIFLU) 75 MG capsule, Take 1 capsule (75 mg total) by mouth 2 (two) times daily., Disp: 10 capsule, Rfl: 0   Syringe/Needle, Disp, (SYRINGE 3CC/22GX1") 22G X 1" 3 ML MISC, 1 application by Does not apply route every 7 (seven) days., Disp: 50 each, Rfl: 3   testosterone cypionate (DEPOTESTOSTERONE CYPIONATE) 200 MG/ML injection, ADMINISTER 1 ML(200 MG) IN THE MUSCLE EVERY 7 DAYS, Disp: 10 mL, Rfl: 0   Medications ordered in this encounter:  Meds ordered this encounter  Medications   amoxicillin-clavulanate (AUGMENTIN) 875-125 MG tablet    Sig: Take 1 tablet by mouth 2 (two) times daily.    Dispense:  14 tablet    Refill:  0    Order Specific Question:   Supervising Provider    Answer:   Merrilee Jansky [[id number]]   fluticasone (FLONASE) 50 MCG/ACT nasal spray    Sig: Place 2 sprays into both  nostrils daily.    Dispense:  16 g    Refill:  0    Order Specific Question:   Supervising Provider    Answer:   Merrilee Jansky [id number]     *If you need refills on other medications prior to your next appointment, please contact your pharmacy*  Follow-Up: Call back or seek an in-person evaluation if the symptoms worsen or if the condition fails to improve as anticipated.  [facility] [phone]  Other Instructions Sinus Infection, Adult A sinus infection, also called sinusitis, is inflammation of your sinuses. Sinuses are hollow spaces in the bones around your face. Your sinuses are located: Around your eyes. In the middle of your forehead. Behind your nose. In your cheekbones. Mucus normally drains out of your sinuses. When your nasal tissues become inflamed or swollen, mucus can become trapped or blocked. This allows bacteria, viruses, and fungi to grow, which leads to infection. Most infections of the sinuses are caused by a virus. A sinus infection can develop quickly. It can last for up to 4 weeks (acute) or for more than 12 weeks (chronic). A sinus infection often develops after a cold. What are the causes? This condition is caused by anything that creates swelling in the sinuses or stops mucus from draining. This includes: Allergies. Asthma. Infection from bacteria or viruses. Deformities or blockages in your nose or sinuses. Abnormal growths in the nose (nasal polyps). Pollutants, such as chemicals or irritants in the  air. Infection from fungi. This is rare. What increases the risk? You are more likely to develop this condition if you: Have a weak body defense system (immune system). Do a lot of swimming or diving. Overuse nasal sprays. Smoke. What are the signs or symptoms? The main symptoms of this condition are pain and a feeling of pressure around the affected sinuses. Other symptoms include: Stuffy nose or congestion that makes it difficult  to breathe through your nose. Thick yellow or greenish drainage from your nose. Tenderness, swelling, and warmth over the affected sinuses. A cough that may get worse at night. Decreased sense of smell and taste. Extra mucus that collects in the throat or the back of the nose (postnasal drip) causing a sore throat or bad breath. Tiredness (fatigue). Fever. How is this diagnosed? This condition is diagnosed based on: Your symptoms. Your medical history. A physical exam. Tests to find out if your condition is acute or chronic. This may include: Checking your nose for nasal polyps. Viewing your sinuses using a device that has a light (endoscope). Testing for allergies or bacteria. Imaging tests, such as an MRI or CT scan. In rare cases, a bone biopsy may be done to rule out more serious types of fungal sinus disease. How is this treated? Treatment for a sinus infection depends on the cause and whether your condition is chronic or acute. If caused by a virus, your symptoms should go away on their own within 10 days. You may be given medicines to relieve symptoms. They include: Medicines that shrink swollen nasal passages (decongestants). A spray that eases inflammation of the nostrils (topical intranasal corticosteroids). Rinses that help get rid of thick mucus in your nose (nasal saline washes). Medicines that treat allergies (antihistamines). Over-the-counter pain relievers. If caused by bacteria, your health care provider may recommend waiting to see if your symptoms improve. Most bacterial infections will get better without antibiotic medicine. You may be given antibiotics if you have: A severe infection. A weak immune system. If caused by narrow nasal passages or nasal polyps, surgery may be needed. Follow these instructions at home: Medicines Take, use, or apply over-the-counter and prescription medicines only as told by your health care provider. These may include nasal  sprays. If you were prescribed an antibiotic medicine, take it as told by your health care provider. Do not stop taking the antibiotic even if you start to feel better. Hydrate and humidify  Drink enough fluid to keep your urine pale yellow. Staying hydrated will help to thin your mucus. Use a cool mist humidifier to keep the humidity level in your home above 50%. Inhale steam for 10-15 minutes, 3-4 times a day, or as told by your health care provider. You can do this in the bathroom while a hot shower is running. Limit your exposure to cool or dry air. Rest Rest as much as possible. Sleep with your head raised (elevated). Make sure you get enough sleep each night. General instructions  Apply a warm, moist washcloth to your face 3-4 times a day or as told by your health care provider. This will help with discomfort. Use nasal saline washes as often as told by your health care provider. Wash your hands often with soap and water to reduce your exposure to germs. If soap and water are not available, use hand sanitizer. Do not smoke. Avoid being around people who are smoking (secondhand smoke). Keep all follow-up visits. This is important. Contact a health care provider if: You  have a fever. Your symptoms get worse. Your symptoms do not improve within 10 days. Get help right away if: You have a severe headache. You have persistent vomiting. You have severe pain or swelling around your face or eyes. You have vision problems. You develop confusion. Your neck is stiff. You have trouble breathing. These symptoms may be an emergency. Get help right away. Call 911. Do not wait to see if the symptoms will go away. Do not drive yourself to the hospital. Summary A sinus infection is soreness and inflammation of your sinuses. Sinuses are hollow spaces in the bones around your face. This condition is caused by nasal tissues that become inflamed or swollen. The swelling traps or blocks the flow  of mucus. This allows bacteria, viruses, and fungi to grow, which leads to infection. If you were prescribed an antibiotic medicine, take it as told by your health care provider. Do not stop taking the antibiotic even if you start to feel better. Keep all follow-up visits. This is important. This information is not intended to replace advice given to you by your health care provider. Make sure you discuss any questions you have with your health care provider. Document Revised: 04/25/2021 Document Reviewed: 04/25/2021 Elsevier Patient Education  2024 Elsevier Inc.    If you have been instructed to have an in-person evaluation today at a local Urgent Care facility, please use the link below. It will take you to a list of all of our available [facility], including address, phone number and hours of operation. Please do not delay care.  [facility]  If you or a family member do not have a primary care provider, use the link below to schedule a visit and establish care. When you choose a [facility] primary care physician or advanced practice provider, you gain a long-term partner in health. Find a Primary Care Provider  Learn more about [place] [facility]'s in-office and virtual care options: [facility] - Get Care Now

## 2023-02-08 NOTE — Progress Notes (Signed)
Virtual Visit Consent   Staffon Saslow, you are scheduled for a virtual visit with a [facility] provider today. Just as with appointments in the office, your consent must be obtained to participate. Your consent will be active for this visit and any virtual visit you may have with one of our providers in the next 365 days. If you have a MyChart account, a copy of this consent can be sent to you electronically.  As this is a virtual visit, video technology does not allow for your provider to perform a traditional examination. This may limit your provider's ability to fully assess your condition. If your provider identifies any concerns that need to be evaluated in person or the need to arrange testing (such as labs, EKG, etc.), we will make arrangements to do so. Although advances in technology are sophisticated, we cannot ensure that it will always work on either your end or our end. If the connection with a video visit is poor, the visit may have to be switched to a telephone visit. With either a video or telephone visit, we are not always able to ensure that we have a secure connection.  By engaging in this virtual visit, you consent to the provision of healthcare and authorize for your insurance to be billed (if applicable) for the services provided during this visit. Depending on your insurance coverage, you may receive a charge related to this service.  I need to obtain your verbal consent now. Are you willing to proceed with your visit today? Tracyn Borghese has provided verbal consent on 02/08/2023 for a virtual visit (video or telephone). Margaretann Loveless, PA-C  Date: 02/08/2023 2:26 PM  Virtual Visit via Video Note   I, Margaretann Loveless, connected with  Philip Nguyen  ([id number], Oct 17, 1990) on 02/08/23 at  2:30 PM EDT by a video-enabled telemedicine application and verified that I am speaking with the correct person using two  identifiers.  Location: Patient: Virtual Visit Location Patient: Home Provider: Virtual Visit Location Provider: Home Office   I discussed the limitations of evaluation and management by telemedicine and the availability of in person appointments. The patient expressed understanding and agreed to proceed.    History of Present Illness: Kevonn Dilorenzo is a 32 y.o. who identifies as a male who was assigned male at birth, and is being seen today for head and chest congestion.  HPI: URI  This is a new problem. The current episode started in the past 7 days. The problem has been gradually worsening. There has been no fever. Associated symptoms include congestion, coughing (just with clearing post nasal drainage), headaches, sinus pain and a sore throat (mildly irritated but now improved). Pertinent negatives include no abdominal pain, diarrhea, ear pain, nausea, plugged ear sensation, rhinorrhea, vomiting or wheezing. Treatments tried: Vit C, dayquil, sudafed, tylenol cold and flu. The treatment provided no relief.     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    Allergies: No Known Allergies Medications:  Current Outpatient Medications:    amoxicillin-clavulanate (AUGMENTIN) 875-125 MG tablet, Take 1 tablet by mouth 2 (two) times daily., Disp: 14 tablet, Rfl: 0   fluticasone (FLONASE) 50 MCG/ACT nasal spray, Place 2 sprays into both nostrils daily., Disp: 16 g, Rfl: 0   benzonatate (TESSALON) 100 MG capsule, Take 1 capsule (100 mg total) by mouth 2 (two) times daily as needed for cough., Disp: 20 capsule, Rfl: 0  oseltamivir (TAMIFLU) 75 MG capsule, Take 1 capsule (75 mg total) by mouth 2 (two) times daily., Disp: 10 capsule, Rfl: 0   Syringe/Needle, Disp, (SYRINGE 3CC/22GX1") 22G X 1" 3 ML MISC, 1 application by Does not apply route every 7 (seven) days., Disp: 50 each, Rfl: 3   testosterone cypionate (DEPOTESTOSTERONE  CYPIONATE) 200 MG/ML injection, ADMINISTER 1 ML(200 MG) IN THE MUSCLE EVERY 7 DAYS, Disp: 10 mL, Rfl: 0  Observations/Objective: Patient is well-developed, well-nourished in no acute distress.  Resting comfortably at home.  Head is normocephalic, atraumatic.  No labored breathing.  Speech is clear and coherent with logical content.  Patient is alert and oriented at baseline.    Assessment and Plan: 1. Acute bacterial sinusitis - amoxicillin-clavulanate (AUGMENTIN) 875-125 MG tablet; Take 1 tablet by mouth 2 (two) times daily.  Dispense: 14 tablet; Refill: 0 - fluticasone (FLONASE) 50 MCG/ACT nasal spray; Place 2 sprays into both nostrils daily.  Dispense: 16 g; Refill: 0  - Worsening symptoms that have not responded to OTC medications.  - Will give Augmentin and Flonase - Continue allergy medications.  - Steam and humidifier can help - Stay well hydrated and get plenty of rest.  - Seek in person evaluation if no symptom improvement or if symptoms worsen   Follow Up Instructions: I discussed the assessment and treatment plan with the patient. The patient was provided an opportunity to ask questions and all were answered. The patient agreed with the plan and demonstrated an understanding of the instructions.  A copy of instructions were sent to the patient via MyChart unless otherwise noted below.    The patient was advised to call back or seek an in-person evaluation if the symptoms worsen or if the condition fails to improve as anticipated.  Time:  I spent 8 minutes with the patient via telehealth technology discussing the above problems/concerns.    Margaretann Loveless, PA-C

## 2023-02-20 ENCOUNTER — Encounter: Payer: BC Managed Care – PPO | Admitting: Physician Assistant

## 2023-02-20 NOTE — Progress Notes (Signed)
Erroneoneous encounter. Parent submitting e-visit for child through their own chart. Discussed proper way for child to receive care.

## 2023-02-21 ENCOUNTER — Other Ambulatory Visit: Payer: Self-pay | Admitting: Family Medicine

## 2023-02-21 NOTE — Telephone Encounter (Signed)
Pt LOV was on 06/18/22 Last refill was done on 05/16/22 Please advise

## 2023-06-17 ENCOUNTER — Encounter: Payer: BC Managed Care – PPO | Admitting: Family Medicine

## 2023-06-21 ENCOUNTER — Encounter: Payer: BC Managed Care – PPO | Admitting: Family Medicine

## 2023-07-03 ENCOUNTER — Ambulatory Visit (INDEPENDENT_AMBULATORY_CARE_PROVIDER_SITE_OTHER): Payer: BC Managed Care – PPO | Admitting: Family Medicine

## 2023-07-03 ENCOUNTER — Encounter: Payer: Self-pay | Admitting: Family Medicine

## 2023-07-03 VITALS — BP 130/80 | HR 93 | Temp 97.8°F | Wt 219.0 lb

## 2023-07-03 DIAGNOSIS — E291 Testicular hypofunction: Secondary | ICD-10-CM

## 2023-07-03 DIAGNOSIS — Z308 Encounter for other contraceptive management: Secondary | ICD-10-CM | POA: Diagnosis not present

## 2023-07-03 DIAGNOSIS — Z0000 Encounter for general adult medical examination without abnormal findings: Secondary | ICD-10-CM | POA: Diagnosis not present

## 2023-07-03 NOTE — Progress Notes (Signed)
Subjective:    Patient ID: Philip Nguyen, male    DOB: 05-28-1991, 33 y.o.   MRN: [id number]  HPI Here for a well exam. He feels fine. He is interested in getting a vasectomy and a colonoscopy. He plans to get married soon, and they both do not want to have children.    Review of Systems  Constitutional: Negative.   HENT: Negative.    Eyes: Negative.   Respiratory: Negative.    Cardiovascular: Negative.   Gastrointestinal: Negative.   Genitourinary: Negative.   Musculoskeletal: Negative.   Skin: Negative.   Neurological: Negative.   Psychiatric/Behavioral: Negative.         Objective:   Physical Exam Constitutional:      General: He is not in acute distress.    Appearance: Normal appearance. He is well-developed. He is not diaphoretic.  HENT:     Head: Normocephalic and atraumatic.     Right Ear: External ear normal.     Left Ear: External ear normal.     Nose: Nose normal.     Mouth/Throat:     Pharynx: No oropharyngeal exudate.  Eyes:     General: No scleral icterus.       Right eye: No discharge.        Left eye: No discharge.     Conjunctiva/sclera: Conjunctivae normal.     Pupils: Pupils are equal, round, and reactive to light.  Neck:     Thyroid: No thyromegaly.     Vascular: No JVD.     Trachea: No tracheal deviation.  Cardiovascular:     Rate and Rhythm: Normal rate and regular rhythm.     Pulses: Normal pulses.     Heart sounds: Normal heart sounds. No murmur heard.    No friction rub. No gallop.  Pulmonary:     Effort: Pulmonary effort is normal. No respiratory distress.     Breath sounds: Normal breath sounds. No wheezing or rales.  Chest:     Chest wall: No tenderness.  Abdominal:     General: Bowel sounds are normal. There is no distension.     Palpations: Abdomen is soft. There is no mass.     Tenderness: There is no abdominal tenderness. There is no guarding or rebound.  Genitourinary:    Penis: Normal. No tenderness.       Testes: Normal.  Musculoskeletal:        General: No tenderness. Normal range of motion.     Cervical back: Neck supple.  Lymphadenopathy:     Cervical: No cervical adenopathy.  Skin:    General: Skin is warm and dry.     Coloration: Skin is not pale.     Findings: No erythema or rash.  Neurological:     General: No focal deficit present.     Mental Status: He is alert and oriented to person, place, and time.     Cranial Nerves: No cranial nerve deficit.     Motor: No abnormal muscle tone.     Coordination: Coordination normal.     Deep Tendon Reflexes: Reflexes are normal and symmetric. Reflexes normal.  Psychiatric:        Mood and Affect: Mood normal.        Behavior: Behavior normal.        Thought Content: Thought content normal.        Judgment: Judgment normal.           Assessment & Plan:  Well exam.  We discussed diet and exercise. Get fasting labs. Refer to Urology for a possible vasectomy. As far as a colonoscopy, I advised him to check with his insurance company about whether they will cover this or not.  Gershon Crane, MD

## 2023-07-05 ENCOUNTER — Other Ambulatory Visit: Payer: BC Managed Care – PPO

## 2023-08-08 ENCOUNTER — Other Ambulatory Visit (INDEPENDENT_AMBULATORY_CARE_PROVIDER_SITE_OTHER)

## 2023-08-08 DIAGNOSIS — E291 Testicular hypofunction: Secondary | ICD-10-CM

## 2023-08-08 DIAGNOSIS — Z0000 Encounter for general adult medical examination without abnormal findings: Secondary | ICD-10-CM

## 2023-08-08 LAB — CBC WITH DIFFERENTIAL/PLATELET
Basophils Absolute: 0.1 10*3/uL (ref 0.0–0.1)
Basophils Relative: 2.1 % (ref 0.0–3.0)
Eosinophils Absolute: 0.3 10*3/uL (ref 0.0–0.7)
Eosinophils Relative: 5.4 % — ABNORMAL HIGH (ref 0.0–5.0)
HCT: 54.3 % — ABNORMAL HIGH (ref 39.0–52.0)
Hemoglobin: 17.5 g/dL — ABNORMAL HIGH (ref 13.0–17.0)
Lymphocytes Relative: 23 % (ref 12.0–46.0)
Lymphs Abs: 1.1 10*3/uL (ref 0.7–4.0)
MCHC: 32.3 g/dL (ref 30.0–36.0)
MCV: 85.8 fl (ref 78.0–100.0)
Monocytes Absolute: 0.4 10*3/uL (ref 0.1–1.0)
Monocytes Relative: 8.4 % (ref 3.0–12.0)
Neutro Abs: 2.9 10*3/uL (ref 1.4–7.7)
Neutrophils Relative %: 61.1 % (ref 43.0–77.0)
Platelets: 210 10*3/uL (ref 150.0–400.0)
RBC: 6.32 Mil/uL — ABNORMAL HIGH (ref 4.22–5.81)
RDW: 14.7 % (ref 11.5–15.5)
WBC: 4.7 10*3/uL (ref 4.0–10.5)

## 2023-08-08 LAB — HEPATIC FUNCTION PANEL
ALT: 34 U/L (ref 0–53)
AST: 54 U/L — ABNORMAL HIGH (ref 0–37)
Albumin: 4.2 g/dL (ref 3.5–5.2)
Alkaline Phosphatase: 23 U/L — ABNORMAL LOW (ref 39–117)
Bilirubin, Direct: 0.3 mg/dL (ref 0.0–0.3)
Total Bilirubin: 1.2 mg/dL (ref 0.2–1.2)
Total Protein: 6.5 g/dL (ref 6.0–8.3)

## 2023-08-08 LAB — PSA: PSA: 0.82 ng/mL (ref 0.10–4.00)

## 2023-08-08 LAB — BASIC METABOLIC PANEL
BUN: 19 mg/dL (ref 6–23)
CO2: 31 meq/L (ref 19–32)
Calcium: 9.6 mg/dL (ref 8.4–10.5)
Chloride: 97 meq/L (ref 96–112)
Creatinine, Ser: 1.1 mg/dL (ref 0.40–1.50)
GFR: 87.26 mL/min (ref >60.00)
Glucose, Bld: 84 mg/dL (ref 70–99)
Potassium: 4.8 meq/L (ref 3.5–5.1)
Sodium: 134 meq/L — ABNORMAL LOW (ref 135–145)

## 2023-08-08 LAB — LIPID PANEL
Cholesterol: 312 mg/dL — ABNORMAL HIGH (ref 0–200)
HDL: 21.5 mg/dL — ABNORMAL LOW (ref >39.00)
LDL Cholesterol: 257 mg/dL — ABNORMAL HIGH (ref 0–99)
NonHDL: 290.44
Total CHOL/HDL Ratio: 15
Triglycerides: 167 mg/dL — ABNORMAL HIGH (ref 0.0–149.0)
VLDL: 33.4 mg/dL (ref 0.0–40.0)

## 2023-08-08 LAB — TESTOSTERONE: Testosterone: 1031.99 ng/dL — ABNORMAL HIGH (ref 300.00–890.00)

## 2023-08-08 LAB — HEMOGLOBIN A1C: Hgb A1c MFr Bld: 5.1 % (ref 4.6–6.5)

## 2023-08-08 LAB — TSH: TSH: 1.29 u[IU]/mL (ref 0.35–5.50)

## 2023-08-09 ENCOUNTER — Other Ambulatory Visit: Payer: BC Managed Care – PPO

## 2023-08-20 ENCOUNTER — Telehealth (INDEPENDENT_AMBULATORY_CARE_PROVIDER_SITE_OTHER): Admitting: Family Medicine

## 2023-08-20 ENCOUNTER — Encounter: Payer: Self-pay | Admitting: Family Medicine

## 2023-08-20 VITALS — Ht 73.0 in | Wt 220.0 lb

## 2023-08-20 DIAGNOSIS — E291 Testicular hypofunction: Secondary | ICD-10-CM
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
## 2023-09-03 ENCOUNTER — Other Ambulatory Visit (INDEPENDENT_AMBULATORY_CARE_PROVIDER_SITE_OTHER)

## 2023-09-03 DIAGNOSIS — E291 Testicular hypofunction: Secondary | ICD-10-CM | POA: Diagnosis not present

## 2023-09-04 LAB — TESTOSTERONE: Testosterone: 13.41 ng/dL — ABNORMAL LOW (ref 300.00–890.00)

## 2023-09-06 ENCOUNTER — Telehealth: Admitting: Physician Assistant

## 2023-09-06 ENCOUNTER — Telehealth: Admitting: Family Medicine

## 2023-09-06 DIAGNOSIS — B9689 Other specified bacterial agents as the cause of diseases classified elsewhere: Secondary | ICD-10-CM | POA: Diagnosis not present

## 2023-09-06 DIAGNOSIS — J301 Allergic rhinitis due to pollen: Secondary | ICD-10-CM | POA: Diagnosis not present

## 2023-09-06 DIAGNOSIS — J019 Acute sinusitis, unspecified: Secondary | ICD-10-CM

## 2023-09-06 DIAGNOSIS — R0981 Nasal congestion: Secondary | ICD-10-CM

## 2023-09-06 MED ORDER — FLUTICASONE PROPIONATE 50 MCG/ACT NA SUSP: 2.0000 | Freq: Every day | NASAL | 6 refills | Status: DC

## 2023-09-06 MED ORDER — FLUTICASONE PROPIONATE 50 MCG/ACT NA SUSP: 2.0000 | Freq: Every day | NASAL | 0 refills | Status: DC

## 2023-09-06 MED ORDER — FEXOFENADINE HCL 180 MG PO TABS: 180.0000 mg | ORAL_TABLET | Freq: Every day | ORAL | 0 refills | Status: DC

## 2023-09-06 NOTE — Progress Notes (Signed)
 E visit for Allergic Rhinitis We are sorry that you are not feeling well.  Here is how we plan to help!  Based on what you have shared with me it looks like you have Allergic Rhinitis.  Rhinitis is when a reaction occurs that causes nasal congestion, runny nose, sneezing, and itching.  Most types of rhinitis are caused by an inflammation and are associated with symptoms in the eyes ears or throat. There are several types of rhinitis.  The most common are acute rhinitis, which is usually caused by a viral illness, allergic or seasonal rhinitis, and nonallergic or year-round rhinitis.  Nasal allergies occur certain times of the year.  Allergic rhinitis is caused when allergens in the air trigger the release of histamine in the body.  Histamine causes itching, swelling, and fluid to build up in the fragile linings of the nasal passages, sinuses and eyelids.  An itchy nose and clear discharge are common.  I recommend the following over the counter treatments: You should take a daily dose of antihistamine and Allegra 60 mg twice daily  I also would recommend a nasal spray: Flonase 2 sprays into each nostril once daily  You may also benefit from eye drops such as: Visine 1-2 drops each eye twice daily as needed  HOME CARE:  You can use an over-the-counter saline nasal spray as needed Avoid areas where there is heavy dust, mites, or molds Stay indoors on windy days during the pollen season Keep windows closed in home, at least in bedroom; use air conditioner. Use high-efficiency house air filter Keep windows closed in car, turn AC on re-circulate Avoid playing out with dog during pollen season  GET HELP RIGHT AWAY IF:  If your symptoms do not improve within 10 days You become short of breath You develop yellow or green discharge from your nose for over 3 days You have coughing fits  MAKE SURE YOU:  Understand these instructions Will watch your condition Will get help right away if you  are not doing well or get worse  Thank you for choosing an e-visit. Your e-visit answers were reviewed by a board certified advanced clinical practitioner to complete your personal care plan. Depending upon the condition, your plan could have included both over the counter or prescription medications. Please review your pharmacy choice. Be sure that the pharmacy you have chosen is open so that you can pick up your prescription now.  If there is a problem you may message your provider in MyChart to have the prescription routed to another pharmacy. Your safety is important to [place]. If you have drug allergies check your prescription carefully.  For the next 24 hours, you can use MyChart to ask questions about today's visit, request a non-urgent call back, or ask for a work or school excuse from your e-visit provider. You will get an email in the next two days asking about your experience. I hope that your e-visit has been valuable and will speed your recovery.  have provided 5 minutes of non face to face time during this encounter for chart review and documentation.

## 2023-09-06 NOTE — Progress Notes (Signed)
 Completed incorrect EV questionnaire. Asked to complete correct one. Will No Charge.

## 2023-09-08 DIAGNOSIS — H66002 Acute suppurative otitis media without spontaneous rupture of ear drum, left ear: Secondary | ICD-10-CM | POA: Diagnosis not present

## 2023-09-08 DIAGNOSIS — J00 Acute nasopharyngitis [common cold]: Secondary | ICD-10-CM | POA: Diagnosis not present

## 2023-09-08 DIAGNOSIS — Z1331 Encounter for screening for depression: Secondary | ICD-10-CM | POA: Diagnosis not present

## 2023-09-10 ENCOUNTER — Ambulatory Visit (INDEPENDENT_AMBULATORY_CARE_PROVIDER_SITE_OTHER): Admitting: Family Medicine

## 2023-09-10 VITALS — BP 130/86 | HR 102 | Temp 98.2°F | Wt 220.0 lb

## 2023-09-10 DIAGNOSIS — J069 Acute upper respiratory infection, unspecified: Secondary | ICD-10-CM

## 2023-09-10 DIAGNOSIS — H66002 Acute suppurative otitis media without spontaneous rupture of ear drum, left ear: Secondary | ICD-10-CM | POA: Diagnosis not present

## 2023-09-10 DIAGNOSIS — Z3009 Encounter for other general counseling and advice on contraception: Secondary | ICD-10-CM | POA: Diagnosis not present

## 2023-09-10 NOTE — Patient Instructions (Addendum)
-  Recommend to continue Augmentin twice a day for 7 days.  -May use over the counter Mucinex for cough and Flonase for nasal congestion.  -If not improved after completing Augmentin, follow up.

## 2023-09-10 NOTE — Progress Notes (Signed)
   Acute Office Visit   Subjective:  Patient ID: Philip Nguyen, male    DOB: Jul 09, 1990, 33 y.o.   MRN: [id number]  Chief Complaint  Patient presents with   Nasal Congestion   Ear Fullness   Ear Pain    HPI:  Patient was seen at [facility] for possible sinus infection s/s since Thursday, left ear fullness. Dx: Acute suppurative otitis medical of left ear without spontaneous rupture of tympanic membrane and cold virus. He was prescribed Augmentin 1 tablet BID for 7 days. He reports he has been doing Netipod.   He reports the Augmentin has helped with the ear pain. Still having some nasal congestion, nasal drainage, and turning into a cough.   ROS See HPI above      Objective:   BP 130/86   Pulse (!) 102   Temp 98.2 F (36.8 C) (Oral)   Wt 220 lb (99.8 kg)   SpO2 98%   BMI 29.03 kg/m    Physical Exam Vitals reviewed.  Constitutional:      General: He is not in acute distress.    Appearance: Normal appearance. He is not ill-appearing, toxic-appearing or diaphoretic.  HENT:     Head: Normocephalic and atraumatic.     Right Ear: Tympanic membrane, ear canal and external ear normal. There is no impacted cerumen.     Left Ear: A middle ear effusion is present. There is no impacted cerumen. Tympanic membrane is erythematous and bulging. Tympanic membrane is not perforated.     Nose:     Right Sinus: No maxillary sinus tenderness or frontal sinus tenderness.     Left Sinus: No maxillary sinus tenderness or frontal sinus tenderness.     Mouth/Throat:     Pharynx: Oropharynx is clear. Uvula midline. No pharyngeal swelling, oropharyngeal exudate, posterior oropharyngeal erythema, uvula swelling or postnasal drip.  Eyes:     General:        Right eye: No discharge.        Left eye: No discharge.     Conjunctiva/sclera: Conjunctivae normal.  Cardiovascular:     Rate and Rhythm: Normal rate and regular rhythm.     Heart sounds: Normal heart sounds. No  murmur heard.    No friction rub. No gallop.  Pulmonary:     Effort: Pulmonary effort is normal. No respiratory distress.     Breath sounds: Normal breath sounds.     Comments: No cough during exam.  Musculoskeletal:        General: Normal range of motion.  Skin:    General: Skin is warm and dry.  Neurological:     General: No focal deficit present.     Mental Status: He is alert and oriented to person, place, and time. Mental status is at baseline.  Psychiatric:        Mood and Affect: Mood normal.        Behavior: Behavior normal.        Thought Content: Thought content normal.        Judgment: Judgment normal.       Assessment & Plan:  Non-recurrent acute suppurative otitis media of left ear without spontaneous rupture of tympanic membrane  Upper respiratory infection, viral  -Recommend to continue Augmentin twice a day for 7 days.  -May use over the counter Mucinex for cough and Flonase for nasal congestion.  -If not improved after completing Augmentin, follow up.   Zandra Abts, NP

## 2023-09-12 ENCOUNTER — Ambulatory Visit: Admitting: Family Medicine

## 2023-11-07 DIAGNOSIS — Z302 Encounter for sterilization: Secondary | ICD-10-CM | POA: Diagnosis not present

## 2023-12-01 ENCOUNTER — Other Ambulatory Visit: Payer: Self-pay | Admitting: Family Medicine

## 2024-01-31 ENCOUNTER — Telehealth: Admitting: Physician Assistant

## 2024-01-31 DIAGNOSIS — J029 Acute pharyngitis, unspecified: Secondary | ICD-10-CM | POA: Diagnosis not present

## 2024-01-31 MED ORDER — AZITHROMYCIN 250 MG PO TABS: ORAL_TABLET | ORAL | 0 refills | Status: AC

## 2024-01-31 MED ORDER — FLUTICASONE PROPIONATE 50 MCG/ACT NA SUSP: 2.0000 | Freq: Every day | NASAL | 0 refills | Status: DC

## 2024-01-31 NOTE — Progress Notes (Signed)
 E-Visit for Upper Respiratory Infection   We are sorry you are not feeling well.  Here is how we plan to help!  Based on what you have shared with me, it looks like you may have a viral upper respiratory infection.  Upper respiratory infections are caused by a large number of viruses; however, rhinovirus is the most common cause.   Symptoms vary from person to person, with common symptoms including sore throat, cough, fatigue or lack of energy and feeling of general discomfort.  A low-grade fever of up to 100.4 may present, but is often uncommon.  Symptoms vary however, and are closely related to a person's age or underlying illnesses.  The most common symptoms associated with an upper respiratory infection are nasal discharge or congestion, cough, sneezing, headache and pressure in the ears and face.  These symptoms usually persist for about 3 to 10 days, but can last up to 2 weeks.  It is important to know that upper respiratory infections do not cause serious illness or complications in most cases.    Upper respiratory infections can be transmitted from person to person, with the most common method of transmission being a person's hands.  The virus is able to live on the skin and can infect other persons for up to 2 hours after direct contact.  Also, these can be transmitted when someone coughs or sneezes; thus, it is important to cover the mouth to reduce this risk.  To keep the spread of the illness at bay, good hand hygiene is very important.  This is an infection that is most likely caused by a virus. There are no specific treatments other than to help you with the symptoms until the infection runs its course.  We are sorry you are not feeling well.  Here is how we plan to help!   For nasal congestion, you may use an oral decongestants such as Mucinex D or if you have glaucoma or high blood pressure use plain Mucinex.  Saline nasal spray or nasal drops can help and can safely be used as often as  needed for congestion.  For your congestion, I have prescribed Fluticasone  nasal spray one spray in each nostril twice a day  If you do not have a history of heart disease, hypertension, diabetes or thyroid  disease, prostate/bladder issues or glaucoma, you may also use Sudafed to treat nasal congestion.  It is highly recommended that you consult with a pharmacist or your primary care physician to ensure this medication is safe for you to take.     If you have a cough, you may use cough suppressants such as Delsym and Robitussin.  If you have glaucoma or high blood pressure, you can also use Coricidin HBP.    If you have a sore or scratchy throat, use a saltwater gargle-  to  teaspoon of salt dissolved in a 4-ounce to 8-ounce glass of warm water.  Gargle the solution for approximately 15-30 seconds and then spit.  It is important not to swallow the solution.  You can also use throat lozenges/cough drops and Chloraseptic spray to help with throat pain or discomfort.  Warm or cold liquids can also be helpful in relieving throat pain.  For headache, pain or general discomfort, you can use Ibuprofen  or Tylenol as directed.   Some authorities believe that zinc sprays or the use of Echinacea may shorten the course of your symptoms.  I do recommend to take an at home Covid test to rule  that out for you before your wedding.   I will send in a Zpack (Azithromycin  250mg  Take 2 tablets on Day 1, then 1 tablet daily for the following 4 days). However, if this is viral you may not see much change. This antibiotic does have some anti-inflammatory properties, so you may see benefit from just calming down the inflammation.    HOME CARE Only take medications as instructed by your medical team. Be sure to drink plenty of fluids. Water is fine as well as fruit juices, sodas and electrolyte beverages. You may want to stay away from caffeine or alcohol. If you are nauseated, try taking small sips of liquids. How do  you know if you are getting enough fluid? Your urine should be a pale yellow or almost colorless. Get rest. Taking a steamy shower or using a humidifier may help nasal congestion and ease sore throat pain. You can place a towel over your head and breathe in the steam from hot water coming from a faucet. Using a saline nasal spray works much the same way. Cough drops, hard candies and sore throat lozenges may ease your cough. Avoid close contacts especially the very young and the elderly Cover your mouth if you cough or sneeze Always remember to wash your hands.   GET HELP RIGHT AWAY IF: You develop worsening fever. If your symptoms do not improve within 10 days You develop yellow or green discharge from your nose over 3 days. You have coughing fits You develop a severe head ache or visual changes. You develop shortness of breath, difficulty breathing or start having chest pain Your symptoms persist after you have completed your treatment plan  MAKE SURE YOU  Understand these instructions. Will watch your condition. Will get help right away if you are not doing well or get worse.  Thank you for choosing an e-visit.  Your e-visit answers were reviewed by a board certified advanced clinical practitioner to complete your personal care plan. Depending upon the condition, your plan could have included both over the counter or prescription medications.  Please review your pharmacy choice. Make sure the pharmacy is open so you can pick up prescription now. If there is a problem, you may contact your provider through [employer] and have the prescription routed to another pharmacy.  Your safety is important to us . If you have drug allergies check your prescription carefully.   For the next 24 hours you can use MyChart to ask questions about today's visit, request a non-urgent call back, or ask for a work or school excuse. You will get an email in the next two days asking about your  experience. I hope that your e-visit has been valuable and will speed your recovery.    I have spent 5 minutes in review of e-visit questionnaire, review and updating patient chart, medical decision making and response to patient.   Delon CHRISTELLA Dickinson, PA-C

## 2024-02-11 ENCOUNTER — Ambulatory Visit: Admitting: Family Medicine

## 2024-03-02 ENCOUNTER — Encounter: Payer: Self-pay | Admitting: Family Medicine

## 2024-03-03 NOTE — Telephone Encounter (Signed)
 Please submit a  PA for pt Testosterone  prescription. Thank you

## 2024-03-04 ENCOUNTER — Telehealth: Payer: Self-pay

## 2024-03-04 ENCOUNTER — Other Ambulatory Visit (HOSPITAL_COMMUNITY): Payer: Self-pay

## 2024-03-04 NOTE — Telephone Encounter (Signed)
 Pharmacy Patient Advocate Encounter   Received notification from Pt Calls Messages that prior authorization for Testosterone  Cyp 200 is required/requested.   Insurance verification completed.   The patient is insured through [facility].   Per test claim: PA required; PA submitted to above mentioned insurance via Latent Key/confirmation #/EOC [id number] Status is pending

## 2024-03-04 NOTE — Telephone Encounter (Signed)
Additional information has been requested from the patient's insurance in order to proceed with the prior authorization request. Requested information has been sent, or form has been filled out and faxed back to [phone]

## 2024-03-04 NOTE — Telephone Encounter (Signed)
 Pharmacy Patient Advocate Encounter  Received notification from [facility] that Prior Authorization for testosterone  cyp 200 has been APPROVED from 03/04/24 to 03/04/25. Ran test claim, Copay is $105.00 for a 70 day supply. This test claim was processed through [facility]- copay amounts may vary at other pharmacies due to pharmacy/plan contracts, or as the patient moves through the different stages of their insurance plan.   PA #/Case ID/Reference #: # [id number]

## 2024-06-08 ENCOUNTER — Encounter: Payer: Self-pay | Admitting: Family Medicine

## 2024-06-08 ENCOUNTER — Ambulatory Visit: Admitting: Family Medicine

## 2024-06-08 VITALS — BP 130/80 | HR 84 | Temp 97.8°F | Wt 214.0 lb

## 2024-06-08 DIAGNOSIS — S39012A Strain of muscle, fascia and tendon of lower back, initial encounter: Secondary | ICD-10-CM

## 2024-06-08 MED ORDER — METHYLPREDNISOLONE ACETATE 80 MG/ML IJ SUSP
80.0000 mg | Freq: Once | INTRAMUSCULAR | Status: AC
  Administered 2024-06-08: 80 mg via INTRAMUSCULAR

## 2024-06-08 MED ORDER — CYCLOBENZAPRINE HCL 10 MG PO TABS: 10.0000 mg | ORAL_TABLET | Freq: Three times a day (TID) | ORAL | 1 refills | Status: AC | PRN

## 2024-06-08 MED ORDER — METHYLPREDNISOLONE ACETATE 40 MG/ML IJ SUSP
40.0000 mg | Freq: Once | INTRAMUSCULAR | Status: AC
  Administered 2024-06-08: 40 mg via INTRAMUSCULAR

## 2024-06-08 NOTE — Progress Notes (Signed)
" ° °  Subjective:    Patient ID: Philip Nguyen, male    DOB: 08/29/1990, 34 y.o.   MRN: [id number]  HPI Here for an injury to his back that occurred while he was in the gym on 06-05-24. While he was lifting up a 50 lb weight off a rack, he felt a pop and a sudden sharp pain in the left lower back. Since then the muscles in this area have been in spasm. He has had a massage and a cupping procedure with mixed results. He is using a TENS unit he borrowed from his mother. He has been taking Ibuprofen  and Aleve with mixed results. No pain in the legs.    Review of Systems  Constitutional: Negative.   Respiratory: Negative.    Cardiovascular: Negative.   Musculoskeletal:  Positive for back pain.       Objective:   Physical Exam Constitutional:      Appearance: Normal appearance.  Cardiovascular:     Rate and Rhythm: Normal rate and regular rhythm.     Pulses: Normal pulses.     Heart sounds: Normal heart sounds.  Pulmonary:     Effort: Pulmonary effort is normal.     Breath sounds: Normal breath sounds.  Musculoskeletal:     Comments: He is mildly tender in the left lower back, and there is a lot of muscular spasm in his area. No spinal tenderness. ROM of the spine is full   Neurological:     Mental Status: He is alert.           Assessment & Plan:  Lumbar strain. He is given a shot of DepoMedrol. Add Flexeril  as needed. He can continue to use the TENS unit as needed. Garnette Olmsted, MD   "

## 2024-06-08 NOTE — Addendum Note (Signed)
 Addended by: LADONNA INOCENTE SAILOR on: 06/08/2024 10:04 AM   Modules accepted: Orders

## 2024-07-01 ENCOUNTER — Encounter: Payer: Self-pay | Admitting: Family Medicine

## 2024-07-01 NOTE — Telephone Encounter (Signed)
 Pt called to verify that Dr Johnny received his message/form for clearance. Procedure is tomorrow. I informed him that we cannot guarantee that form will be completed today since it is last minute but Dr Johnny will work on it when he has a moment.

## 2024-07-01 NOTE — Telephone Encounter (Signed)
 Patient has called to get an update on his form being filled out. He states he needs to have the forms filled out today before the office closes necause his procedure is 6:45 am. Madeline did advise that the forms was given to Dr Johnny and I also advised patient of the normal turn around time for completion of the forms and it was not a timeframe on when he could get to it today due to him being with patients.

## 2024-07-01 NOTE — Telephone Encounter (Signed)
 Agent will let pt know dr fry has the medical clearance form and he is with patient

## 2024-07-02 ENCOUNTER — Telehealth: Payer: Self-pay

## 2024-07-02 NOTE — Telephone Encounter (Signed)
 Copied from CRM #[id number]. Topic: Clinical - Medical Advice >> Jul 02, 2024  8:00 AM Zy'onna H wrote: Reason for CRM: Representative called in on behalf of patient informing they have not received the clearance needed for his procedure this morning.   An individual from the surgery team, informed to please let them know no later than 9 am.  He

## 2024-07-02 NOTE — Telephone Encounter (Signed)
 Pt form/ last office notes and last lab results have been successfully faxed as requested, pt notified. Copy of the form is on my desk

## 2024-07-02 NOTE — Telephone Encounter (Signed)
 The form is ready

## 2024-07-02 NOTE — Telephone Encounter (Signed)
 Pt clearance form was printed this morning and placed in Dr Johnny red folder, pt form will be faxed once completed
